# Patient Record
Sex: Male | Born: 1985 | Race: White | Hispanic: No | Marital: Married | Smoking: Never smoker
Health system: Southern US, Community
[De-identification: ages and names within clinical notes are randomized; demographics above are authoritative.]

## PROBLEM LIST (undated history)

## (undated) DIAGNOSIS — J45909 Unspecified asthma, uncomplicated: Secondary | ICD-10-CM

## (undated) DIAGNOSIS — G4730 Sleep apnea, unspecified: Secondary | ICD-10-CM

## (undated) DIAGNOSIS — F419 Anxiety disorder, unspecified: Secondary | ICD-10-CM

## (undated) DIAGNOSIS — J069 Acute upper respiratory infection, unspecified: Secondary | ICD-10-CM

## (undated) HISTORY — PX: NO PAST SURGERIES: SHX2092

## (undated) HISTORY — DX: Unspecified asthma, uncomplicated: J45.909

## (undated) HISTORY — DX: Acute upper respiratory infection, unspecified: J06.9

## (undated) HISTORY — PX: SINOSCOPY: SHX187

---

## 2010-09-29 ENCOUNTER — Other Ambulatory Visit: Payer: Self-pay | Admitting: Endocrinology

## 2010-09-29 DIAGNOSIS — E291 Testicular hypofunction: Secondary | ICD-10-CM

## 2010-10-04 ENCOUNTER — Ambulatory Visit
Admission: RE | Admit: 2010-10-04 | Discharge: 2010-10-04 | Disposition: A | Discharge status: Home / Self Care | Payer: BC Managed Care – PPO | Source: Ambulatory Visit | Attending: Endocrinology | Admitting: Endocrinology

## 2010-10-04 DIAGNOSIS — E291 Testicular hypofunction: Secondary | ICD-10-CM

## 2010-10-04 MED ORDER — GADOBENATE DIMEGLUMINE 529 MG/ML IV SOLN
10.0000 mL | Freq: Once | INTRAVENOUS | Status: AC | PRN
  Administered 2010-10-04: 10 mL via INTRAVENOUS

## 2014-03-03 ENCOUNTER — Ambulatory Visit: Payer: Self-pay

## 2014-03-03 ENCOUNTER — Other Ambulatory Visit: Payer: Self-pay | Admitting: Occupational Medicine

## 2014-03-03 DIAGNOSIS — R52 Pain, unspecified: Secondary | ICD-10-CM

## 2015-03-08 ENCOUNTER — Other Ambulatory Visit (HOSPITAL_COMMUNITY): Payer: Self-pay | Admitting: Family Medicine

## 2015-03-08 DIAGNOSIS — R221 Localized swelling, mass and lump, neck: Principal | ICD-10-CM

## 2015-03-08 DIAGNOSIS — R220 Localized swelling, mass and lump, head: Secondary | ICD-10-CM

## 2015-03-10 ENCOUNTER — Ambulatory Visit (HOSPITAL_COMMUNITY)
Admission: RE | Admit: 2015-03-10 | Discharge: 2015-03-10 | Disposition: A | Discharge status: Home / Self Care | Payer: PRIVATE HEALTH INSURANCE | Source: Ambulatory Visit | Attending: Family Medicine | Admitting: Family Medicine

## 2015-03-10 DIAGNOSIS — R220 Localized swelling, mass and lump, head: Secondary | ICD-10-CM

## 2015-03-10 DIAGNOSIS — R221 Localized swelling, mass and lump, neck: Secondary | ICD-10-CM | POA: Insufficient documentation

## 2018-12-26 ENCOUNTER — Ambulatory Visit (INDEPENDENT_AMBULATORY_CARE_PROVIDER_SITE_OTHER): Payer: Self-pay | Admitting: Internal Medicine

## 2019-05-17 ENCOUNTER — Other Ambulatory Visit (INDEPENDENT_AMBULATORY_CARE_PROVIDER_SITE_OTHER): Payer: Self-pay | Admitting: Internal Medicine

## 2019-06-26 ENCOUNTER — Encounter (INDEPENDENT_AMBULATORY_CARE_PROVIDER_SITE_OTHER): Payer: Self-pay | Admitting: Internal Medicine

## 2019-06-26 ENCOUNTER — Ambulatory Visit (INDEPENDENT_AMBULATORY_CARE_PROVIDER_SITE_OTHER): Payer: PRIVATE HEALTH INSURANCE | Admitting: Internal Medicine

## 2019-06-26 ENCOUNTER — Other Ambulatory Visit: Payer: Self-pay

## 2019-06-26 VITALS — BP 130/70 | HR 64 | Ht 67.0 in | Wt 205.0 lb

## 2019-06-26 DIAGNOSIS — Z125 Encounter for screening for malignant neoplasm of prostate: Secondary | ICD-10-CM

## 2019-06-26 DIAGNOSIS — Z23 Encounter for immunization: Secondary | ICD-10-CM

## 2019-06-26 DIAGNOSIS — Z0001 Encounter for general adult medical examination with abnormal findings: Secondary | ICD-10-CM | POA: Diagnosis not present

## 2019-06-26 DIAGNOSIS — R7989 Other specified abnormal findings of blood chemistry: Secondary | ICD-10-CM

## 2019-06-26 DIAGNOSIS — F988 Other specified behavioral and emotional disorders with onset usually occurring in childhood and adolescence: Secondary | ICD-10-CM | POA: Insufficient documentation

## 2019-06-26 HISTORY — DX: Other specified abnormal findings of blood chemistry: R79.89

## 2019-06-26 HISTORY — DX: Other specified behavioral and emotional disorders with onset usually occurring in childhood and adolescence: F98.8

## 2019-06-26 NOTE — Progress Notes (Signed)
Chief Complaint: This very pleasant 33 year old man comes in for his annual physical exam. HPI: He is doing reasonably well but he continues to describe feeling exhausted for the next 24 hours after sexual intercourse.  He has had this issue for a long time and there does not seem to be any clear etiology or remedy. He has a history of low testosterone and takes testosterone therapy which he tolerates very well.  His last levels were in a good range. He continues to exercise on a regular basis now. He does have a history of vitamin D deficiency and his last levels were in a good range when he was taking appropriate doses.  Past Medical History:  Diagnosis Date  . ADD (attention deficit disorder) 06/26/2019  . 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 No pertinent surgical history.   Social History   Social History Narrative   Married for 10 years.Lives with wife and daughter.Engineer, structural in [place].    Social History   Tobacco Use  . Smoking status: Never Smoker  . Smokeless tobacco: Never Used  Substance Use Topics  . Alcohol use: Not on file    Comment: occ      Allergies: Not on File   Current Meds  Medication Sig  . Cholecalciferol (VITAMIN D3) 125 MCG (5000 UT) TABS Take 2 tablets by mouth daily.  Marland Kitchen testosterone cypionate (DEPOTESTOSTERONE CYPIONATE) 200 MG/ML injection INJECT ONE ML INTRAMUSCULARLY TWICE A WEEK.  Marland Kitchen thyroid (NP THYROID) 90 MG tablet Take 180 mg by mouth daily.      Bio-identical hormones Testosterone therapy is being used off label for symptoms of testosterone deficiency and benefits that it produces based on several studies.  These benefits include decreasing body fat, increasing in lean muscle mass and increasing in bone density.  There is improvement of memory, cognition.  There is improvement in exercise tolerance and endurance.  Testosterone therapy has also been shown to be protective against coronary artery disease,  cerebrovascular disease, diabetes, hypertension and degenerative joint disease. I have discussed with the patient the FDA warnings regarding testosterone therapy, benefits and side effects and modes of administration as well as monitoring blood levels and side effects  on a regular basis The patient is agreeable that testosterone therapy should be an integral part of his/her wellness,quality of life and prevention of chronic disease.  [id number] from the symptoms mentioned above,there are no other symptoms referable to all systems reviewed.  Physical Exam: Blood pressure 130/70, pulse 64, height '5\' 7"'  (1.702 m), weight 205 lb (93 kg). Vitals with BMI 06/26/2019  Height '5\' 7"'   Weight 205 lbs  BMI 53.7  Systolic 482  Diastolic 70  Pulse 64      He looks systemically well. General: Alert, cooperative, and appears to be the stated age.No pallor.  No jaundice.  No clubbing. Head: Normocephalic Eyes: Sclera white, pupils equal and reactive to light, red reflex x 2,  Ears: Normal bilaterally Oral cavity: Lips, mucosa, and tongue normal: Teeth and gums normal Neck: No adenopathy, supple, symmetrical, trachea midline, and thyroid does not appear enlarged Respiratory: Clear to auscultation bilaterally.No wheezing, crackles or bronchial breathing. Cardiovascular: Heart sounds are present and appear to be normal without murmurs or added sounds.  No carotid bruits.  Peripheral pulses are present and equal bilaterally.: Gastrointestinal:positive bowel sounds, no hepatosplenomegaly.  No masses felt.No tenderness. Skin: Clear, No rashes noted.No worrisome skin lesions seen. Neurological: Grossly intact without focal findings, cranial nerves II through XII  intact, muscle strength equal bilaterally Musculoskeletal: No acute joint abnormalities noted.Full range of movement noted with joints. Psychiatric: Affect appropriate, non-anxious.    Assessment  1. Encounter for general adult medical  examination with abnormal findings   2. Attention deficit disorder, unspecified hyperactivity presence   3. Low testosterone in male   4. Special screening for malignant neoplasm of prostate     Tests Ordered:   Orders Placed This Encounter  Procedures  . CMP with eGFR([facility])  . T3, Free  . PSA     Plan  1. This is a healthy 33 year old man. 2. Blood work is ordered as above. 3. He was given influenza vaccination today. 4. Further recommendations will depend on blood results and I will see him in 6 months time for follow-up     No orders of the defined types were placed in this encounter.     C    06/26/2019, 10:53 AM

## 2019-06-27 LAB — COMPLETE METABOLIC PANEL WITH GFR
AG Ratio: 1.8 (calc) (ref 1.0–2.5)
ALT: 40 U/L (ref 9–46)
AST: 27 U/L (ref 10–40)
Albumin: 4.4 g/dL (ref 3.6–5.1)
Alkaline phosphatase (APISO): 48 U/L (ref 36–130)
BUN/Creatinine Ratio: 14 (calc) (ref 6–22)
BUN: 19 mg/dL (ref 7–25)
CO2: 27 mmol/L (ref 20–32)
Calcium: 9.6 mg/dL (ref 8.6–10.3)
Chloride: 102 mmol/L (ref 98–110)
Creat: 1.38 mg/dL — ABNORMAL HIGH (ref 0.60–1.35)
GFR, Est African American: 77 mL/min/{1.73_m2} (ref >=60)
GFR, Est Non African American: 67 mL/min/{1.73_m2} (ref >=60)
Globulin: 2.5 g/dL (calc) (ref 1.9–3.7)
Glucose, Bld: 82 mg/dL (ref 65–99)
Potassium: 4 mmol/L (ref 3.5–5.3)
Sodium: 137 mmol/L (ref 135–146)
Total Bilirubin: 0.8 mg/dL (ref 0.2–1.2)
Total Protein: 6.9 g/dL (ref 6.1–8.1)

## 2019-06-27 LAB — T3, FREE: T3, Free: 3.2 pg/mL (ref 2.3–4.2)

## 2019-06-27 LAB — PSA: PSA: 0.6 ng/mL (ref <=4.0)

## 2019-07-10 ENCOUNTER — Other Ambulatory Visit: Payer: Self-pay

## 2019-07-10 DIAGNOSIS — Z20822 Contact with and (suspected) exposure to covid-19: Secondary | ICD-10-CM

## 2019-07-13 LAB — NOVEL CORONAVIRUS, NAA: SARS-CoV-2, NAA: DETECTED — AB

## 2019-08-11 ENCOUNTER — Other Ambulatory Visit (INDEPENDENT_AMBULATORY_CARE_PROVIDER_SITE_OTHER): Payer: Self-pay | Admitting: Internal Medicine

## 2019-08-11 ENCOUNTER — Telehealth (INDEPENDENT_AMBULATORY_CARE_PROVIDER_SITE_OTHER): Payer: Self-pay

## 2019-08-12 NOTE — Telephone Encounter (Signed)
Faxed to office wife at her work.

## 2019-08-27 ENCOUNTER — Other Ambulatory Visit (INDEPENDENT_AMBULATORY_CARE_PROVIDER_SITE_OTHER): Payer: Self-pay | Admitting: Internal Medicine

## 2019-11-03 ENCOUNTER — Other Ambulatory Visit (INDEPENDENT_AMBULATORY_CARE_PROVIDER_SITE_OTHER): Payer: Self-pay | Admitting: Internal Medicine

## 2019-12-24 ENCOUNTER — Other Ambulatory Visit: Payer: Self-pay

## 2019-12-24 ENCOUNTER — Ambulatory Visit (INDEPENDENT_AMBULATORY_CARE_PROVIDER_SITE_OTHER): Payer: PRIVATE HEALTH INSURANCE | Admitting: Internal Medicine

## 2019-12-24 ENCOUNTER — Encounter (INDEPENDENT_AMBULATORY_CARE_PROVIDER_SITE_OTHER): Payer: Self-pay | Admitting: Internal Medicine

## 2019-12-24 VITALS — BP 132/80 | HR 68 | Temp 97.2°F | Resp 18 | Ht 67.0 in | Wt 204.0 lb

## 2019-12-24 DIAGNOSIS — E559 Vitamin D deficiency, unspecified: Secondary | ICD-10-CM

## 2019-12-24 DIAGNOSIS — R7989 Other specified abnormal findings of blood chemistry: Secondary | ICD-10-CM

## 2019-12-24 NOTE — Progress Notes (Signed)
Metrics: Intervention Frequency ACO  Documented Smoking Status Yearly  Screened one or more times in 24 months  Cessation Counseling or  Active cessation medication Past 24 months  Past 24 months   Guideline developer: UpToDate (See UpToDate for funding source) Date Released: 2014       Wellness Office Visit  Subjective:  Patient ID: Brady Conley, male    DOB: 02/24/1986  Age: 34 y.o. MRN: [id number]  CC: This man comes in for follow-up of his hypogonadism, vitamin D deficiency. HPI  He is doing very well.  He continues on testosterone therapy as before and he feels well on this particular dose. He also continues on NP thyroid, off label, for symptoms of thyroid deficiency. He also continues on vitamin D3 supplementation for vitamin D deficiency. He continues to workout with strength training at least twice a week and then cardio with bicycling once a week. He has had recently a lot of stress as they have just finished  building a new house. Past Medical History:  Diagnosis Date  . ADD (attention deficit disorder) 06/26/2019  . Low testosterone in male 06/26/2019      History reviewed. No pertinent family history.  Social History   Social History Narrative   Married for 10 years.Lives with wife and daughter.Emergency planning/management officer in [place].   Social History   Tobacco Use  . Smoking status: Never Smoker  . Smokeless tobacco: Never Used  Substance Use Topics  . Alcohol use: Not on file    Comment: occ    Current Meds  Medication Sig  . Cholecalciferol (VITAMIN D3) 125 MCG (5000 UT) TABS Take 2 tablets by mouth daily.  . meloxicam (MOBIC) 15 MG tablet Take 15 mg by mouth daily.  . NP THYROID 90 MG tablet TAKE ONE TABLET BY MOUTH TWICE DAILY.  Marland Kitchen testosterone cypionate (DEPOTESTOSTERONE CYPIONATE) 200 MG/ML injection INJECT ONE ML INTRAMUSCULARLY TWICE A WEEK.       Objective:   Today's Vitals: BP 132/80 (BP Location: Left Arm, Patient Position: Sitting, Cuff Size:  Normal)   Pulse 68   Temp (!) 97.2 F (36.2 C) (Temporal)   Resp 18   Ht 5\' 7"  (1.702 m)   Wt 204 lb (92.5 kg)   SpO2 98% Comment: wearing mask.  BMI 31.95 kg/m  Vitals with BMI 12/24/2019 06/26/2019  Height 5\' 7"  5\' 7"   Weight 204 lbs 205 lbs  BMI 31.94 32.1  Systolic 132 130  Diastolic 80 70  Pulse 68 64     Physical Exam   He looks systemically well.  Weight is stable.  Blood pressure is reasonable.  He is alert and oriented without any focal neurological signs.    Assessment   1. Low testosterone in male   2. Vitamin D deficiency disease       Tests ordered No orders of the defined types were placed in this encounter.    Plan: 1. He will continue with all medications above. 2. He seems to be very stable at this point and I will see him in about 6 months time for an annual physical exam.   No orders of the defined types were placed in this encounter.   13/12/2018, MD

## 2019-12-26 ENCOUNTER — Other Ambulatory Visit (INDEPENDENT_AMBULATORY_CARE_PROVIDER_SITE_OTHER): Payer: Self-pay | Admitting: Internal Medicine

## 2020-03-01 ENCOUNTER — Other Ambulatory Visit (INDEPENDENT_AMBULATORY_CARE_PROVIDER_SITE_OTHER): Payer: Self-pay | Admitting: Internal Medicine

## 2020-03-23 ENCOUNTER — Other Ambulatory Visit: Payer: Self-pay

## 2020-03-23 ENCOUNTER — Ambulatory Visit
Admission: EM | Admit: 2020-03-23 | Discharge: 2020-03-23 | Disposition: A | Discharge status: Home / Self Care | Payer: No Typology Code available for payment source | Attending: Emergency Medicine | Admitting: Emergency Medicine

## 2020-03-23 ENCOUNTER — Encounter: Payer: Self-pay | Admitting: Emergency Medicine

## 2020-03-23 DIAGNOSIS — Z1152 Encounter for screening for COVID-19: Secondary | ICD-10-CM

## 2020-03-23 DIAGNOSIS — J069 Acute upper respiratory infection, unspecified: Secondary | ICD-10-CM

## 2020-03-23 DIAGNOSIS — R0981 Nasal congestion: Secondary | ICD-10-CM

## 2020-03-23 MED ORDER — PREDNISONE 20 MG PO TABS: 20.0000 mg | ORAL_TABLET | Freq: Two times a day (BID) | ORAL | 0 refills | Status: AC

## 2020-03-23 MED ORDER — BENZONATATE 100 MG PO CAPS
100.0000 mg | ORAL_CAPSULE | Freq: Three times a day (TID) | ORAL | 0 refills | Status: DC
Start: 2020-03-23 — End: 2021-10-06

## 2020-03-23 NOTE — ED Triage Notes (Signed)
Provider triage  

## 2020-03-23 NOTE — ED Provider Notes (Signed)
[facility]   [id number] 03/23/20 Arrival Time: 3474   CC: Nasal congestion  SUBJECTIVE: History from: patient.  Brady Conley is a 34 y.o. male who presents with nasal congestion and mild dry/ productive cough x 3 days.  Denies sick exposure to COVID, flu or strep.  Has tried OTC flonase without relief.  Symptoms are made worse at night.  Reports previous COVID infection in the past.   Denies fever, chills, fatigue, rhinorrhea, sore throat, SOB, wheezing, chest pain, nausea, changes in bowel or bladder habits.    ROS: As per HPI.  All other pertinent ROS negative.     Past Medical History:  Diagnosis Date  . ADD (attention deficit disorder) 06/26/2019  . 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 No pertinent surgical history. No Known Allergies No current facility-administered medications on file prior to encounter.   Current Outpatient Medications on File Prior to Encounter  Medication Sig Dispense Refill  . Cholecalciferol (VITAMIN D3) 125 MCG (5000 UT) TABS Take 2 tablets by mouth daily.    . meloxicam (MOBIC) 15 MG tablet Take 15 mg by mouth daily.    . NP THYROID 90 MG tablet TAKE ONE TABLET BY MOUTH TWICE DAILY. 180 tablet 0  . testosterone cypionate (DEPOTESTOSTERONE CYPIONATE) 200 MG/ML injection INJECT ONE ML INTRAMUSCULARLY TWICE A WEEK. 10 mL 2  . [DISCONTINUED] thyroid (NP THYROID) 90 MG tablet Take 180 mg by mouth daily.     Social History   Socioeconomic History  . Marital status: Married    Spouse name: Not on file  . Number of children: Not on file  . Years of education: Not on file  . Highest education level: Not on file  Occupational History  . Not on file  Tobacco Use  . Smoking status: Never Smoker  . Smokeless tobacco: Never Used  Substance and Sexual Activity  . Alcohol use: Not on file    Comment: occ  . Drug use: Not Currently  . Sexual activity: Not on file  Other Topics Concern  . Not on file  Social History  Narrative   Married for 10 years.Lives with wife and daughter.Emergency planning/management officer in [place].   Social Determinants of Health   Financial Resource Strain:   . Difficulty of Paying Living Expenses:   Food Insecurity:   . Worried About Programme researcher, broadcasting/film/video in the Last Year:   . Barista in the Last Year:   Transportation Needs:   . Freight forwarder (Medical):   Marland Kitchen Lack of Transportation (Non-Medical):   Physical Activity:   . Days of Exercise per Week:   . Minutes of Exercise per Session:   Stress:   . Feeling of Stress :   Social Connections:   . Frequency of Communication with Friends and Family:   . Frequency of Social Gatherings with Friends and Family:   . Attends Religious Services:   . Active Member of Clubs or Organizations:   . Attends Banker Meetings:   Marland Kitchen Marital Status:   Intimate Partner Violence:   . Fear of Current or Ex-Partner:   . Emotionally Abused:   Marland Kitchen Physically Abused:   . Sexually Abused:    History reviewed. No pertinent family history.  OBJECTIVE:  Vitals:   03/23/20 0840  BP: (!) 147/86  Pulse: 71  Resp: 17  Temp: 98.2 F (36.8 C)  TempSrc: Oral  SpO2: 98%    General appearance: alert; well-appearing, nontoxic; speaking in  full sentences and tolerating own secretions HEENT: NCAT; Ears: EACs clear, TMs pearly gray; Eyes: PERRL.  EOM grossly intact.Nose: nares patent without rhinorrhea, Throat: oropharynx clear, tonsils non erythematous or enlarged, uvula midline  Neck: supple without LAD Lungs: unlabored respirations, symmetrical air entry; cough: absent; no respiratory distress; CTAB Heart: regular rate and rhythm.  Skin: warm and dry Psychological: alert and cooperative; normal mood and affect   ASSESSMENT & PLAN:  1. Encounter for screening for COVID-19   2. Viral URI with cough   3. Nasal congestion     Meds ordered this encounter  Medications  . benzonatate (TESSALON) 100 MG capsule    Sig: Take 1 capsule (100  mg total) by mouth every 8 (eight) hours.    Dispense:  21 capsule    Refill:  0    Order Specific Question:   Supervising Provider    Answer:   Eustace Moore [[id number]]  . predniSONE (DELTASONE) 20 MG tablet    Sig: Take 1 tablet (20 mg total) by mouth 2 (two) times daily with a meal for 5 days.    Dispense:  10 tablet    Refill:  0    Order Specific Question:   Supervising Provider    Answer:   Eustace Moore [[id number]]    COVID testing ordered.  It will take between 2-5 days for test results.  Someone will contact you regarding abnormal results.    In the meantime: You should remain isolated in your home for 10 days from symptom onset AND greater than 72 hours after symptoms resolution (absence of fever without the use of fever-reducing medication and improvement in respiratory symptoms), whichever is longer Get plenty of rest and push fluids Tessalon Perles prescribed for cough Use OTC zyrtec for nasal congestion, runny nose, and/or sore throat Use OTC flonase for nasal congestion and runny nose Use medications daily for symptom relief Use OTC medications like ibuprofen or tylenol as needed fever or pain Call or go to the ED if you have any new or worsening symptoms such as fever, worsening cough, shortness of breath, chest tightness, chest pain, turning blue, changes in mental status, etc...   Prednisone prescribed for nasal congestion  Reviewed expectations re: course of current medical issues. Questions answered. Outlined signs and symptoms indicating need for more acute intervention. Patient verbalized understanding. After Visit Summary given.         Alvino Chapel [place], PA-C 03/23/20 [phone]

## 2020-03-23 NOTE — Discharge Instructions (Signed)

## 2020-03-24 LAB — NOVEL CORONAVIRUS, NAA: SARS-CoV-2, NAA: NOT DETECTED

## 2020-03-24 LAB — SARS-COV-2, NAA 2 DAY TAT

## 2020-03-29 ENCOUNTER — Telehealth (INDEPENDENT_AMBULATORY_CARE_PROVIDER_SITE_OTHER): Payer: Self-pay

## 2020-03-29 NOTE — Telephone Encounter (Signed)
COVER MY MEDS: Request Reference Number: [id number]. TESTOST CYP INJ 200MG /ML is approved through 03/29/2021. Your patient may now fill this prescription and it will be covered.

## 2020-04-16 ENCOUNTER — Other Ambulatory Visit: Payer: Self-pay

## 2020-04-16 ENCOUNTER — Other Ambulatory Visit (HOSPITAL_COMMUNITY): Payer: Self-pay | Admitting: Family Medicine

## 2020-04-16 ENCOUNTER — Ambulatory Visit (HOSPITAL_COMMUNITY)
Admission: RE | Admit: 2020-04-16 | Discharge: 2020-04-16 | Disposition: A | Discharge status: Home / Self Care | Payer: No Typology Code available for payment source | Source: Ambulatory Visit | Attending: Family Medicine | Admitting: Family Medicine

## 2020-04-16 DIAGNOSIS — R059 Cough, unspecified: Secondary | ICD-10-CM

## 2020-04-16 DIAGNOSIS — R05 Cough: Secondary | ICD-10-CM | POA: Diagnosis not present

## 2020-05-17 LAB — LAB REPORT - SCANNED
A1c: 5.1
EGFR (Non-African Amer.): 67
Free T4: 1.33 ng/dL
TSH: 4.34

## 2020-05-20 ENCOUNTER — Other Ambulatory Visit (INDEPENDENT_AMBULATORY_CARE_PROVIDER_SITE_OTHER): Payer: Self-pay | Admitting: Internal Medicine

## 2020-05-20 NOTE — Telephone Encounter (Signed)
Request for NP thyroid refill.  

## 2020-05-21 ENCOUNTER — Other Ambulatory Visit (INDEPENDENT_AMBULATORY_CARE_PROVIDER_SITE_OTHER): Payer: Self-pay | Admitting: Internal Medicine

## 2020-05-21 MED ORDER — THYROID 90 MG PO TABS
90.0000 mg | ORAL_TABLET | Freq: Two times a day (BID) | ORAL | 0 refills | Status: DC
Start: 2020-05-21 — End: 2021-10-06

## 2020-06-28 ENCOUNTER — Encounter (INDEPENDENT_AMBULATORY_CARE_PROVIDER_SITE_OTHER): Payer: No Typology Code available for payment source | Admitting: Internal Medicine

## 2021-08-13 ENCOUNTER — Ambulatory Visit (INDEPENDENT_AMBULATORY_CARE_PROVIDER_SITE_OTHER): Payer: No Typology Code available for payment source

## 2021-08-13 ENCOUNTER — Ambulatory Visit
Admission: EM | Admit: 2021-08-13 | Discharge: 2021-08-13 | Disposition: A | Discharge status: Home / Self Care | Payer: No Typology Code available for payment source | Attending: Physician Assistant | Admitting: Physician Assistant

## 2021-08-13 ENCOUNTER — Encounter: Payer: Self-pay | Admitting: Emergency Medicine

## 2021-08-13 ENCOUNTER — Other Ambulatory Visit: Payer: Self-pay

## 2021-08-13 DIAGNOSIS — R051 Acute cough: Secondary | ICD-10-CM | POA: Diagnosis not present

## 2021-08-13 DIAGNOSIS — R059 Cough, unspecified: Secondary | ICD-10-CM

## 2021-08-13 DIAGNOSIS — J0191 Acute recurrent sinusitis, unspecified: Secondary | ICD-10-CM

## 2021-08-13 DIAGNOSIS — R0602 Shortness of breath: Secondary | ICD-10-CM

## 2021-08-13 DIAGNOSIS — R509 Fever, unspecified: Secondary | ICD-10-CM

## 2021-08-13 MED ORDER — ACETAMINOPHEN 325 MG PO TABS
975.0000 mg | ORAL_TABLET | Freq: Once | ORAL | Status: AC
  Administered 2021-08-13: 12:00:00 975 mg via ORAL

## 2021-08-13 MED ORDER — PREDNISONE 10 MG (21) PO TBPK: ORAL_TABLET | Freq: Every day | ORAL | 0 refills | Status: DC

## 2021-08-13 MED ORDER — AMOXICILLIN-POT CLAVULANATE 875-125 MG PO TABS: 1.0000 | ORAL_TABLET | Freq: Two times a day (BID) | ORAL | 0 refills | Status: DC

## 2021-08-13 NOTE — ED Triage Notes (Signed)
Pt is present today with fever and cough. Pt states that sx started Tuesday

## 2021-08-13 NOTE — ED Provider Notes (Signed)
[facility]    CSN: [id number] Arrival date & time: 08/13/21  1014      History   Chief Complaint Chief Complaint  Patient presents with   Fever   Cough    HPI Brady Conley is a 35 y.o. male.   Pt complains of congestion, fever, and cough.  He has been dealing with symptoms over the last week.  Completed Augmentin 08/07/2021.  Had left over prednisone and took that as well.  He was feeling much better until Tuesday when he started coughing again.  Seen by PCP on Thursday.  He was prescribed inhaler and tessalon pearls. Yesterday he began experiencing increased sinus pressure.  Negative home COVID test this morning. He denies night sweats,   He is currently taking cherry tussin with some relief.    Past Medical History:  Diagnosis Date   ADD (attention deficit disorder) 06/26/2019   Low testosterone in male 06/26/2019    Patient Active Problem List   Diagnosis Date Noted   ADD (attention deficit disorder) 06/26/2019   Low testosterone in male 06/26/2019    History reviewed. No pertinent surgical history.     Home Medications    Prior to Admission medications   Medication Sig Start Date End Date Taking? Authorizing Provider  amoxicillin-clavulanate (AUGMENTIN) 875-125 MG tablet Take 1 tablet by mouth every 12 (twelve) hours. 08/13/21  Yes Ward, Lenise Arena, PA-C  predniSONE (STERAPRED UNI-PAK 21 TAB) 10 MG (21) TBPK tablet Take by mouth daily. Take 6 tabs by mouth daily  for 2 days, then 5 tabs for 2 days, then 4 tabs for 2 days, then 3 tabs for 2 days, 2 tabs for 2 days, then 1 tab by mouth daily for 2 days 08/13/21  Yes Ward, Lenise Arena, PA-C  albuterol (VENTOLIN HFA) 108 (90 Base) MCG/ACT inhaler SMARTSIG:1-2 Puff(s) Via Inhaler Every 4 Hours PRN 08/11/21   [provider]  ALPRAZolam Duanne Moron) 0.5 MG tablet Take 0.5 mg by mouth daily. 06/01/21   [provider]  anastrozole (ARIMIDEX) 1 MG tablet SMARTSIG:0.5 Tablet(s) By Mouth Once a  Week 06/14/21   [provider]  benzonatate (TESSALON) 100 MG capsule Take 1 capsule (100 mg total) by mouth every 8 (eight) hours. 03/23/20   Wurst, [place], PA-C  Cholecalciferol (VITAMIN D3) 125 MCG (5000 UT) TABS Take 2 tablets by mouth daily.    [provider]  meloxicam (MOBIC) 15 MG tablet Take 15 mg by mouth daily.    [provider]  tadalafil (CIALIS) 5 MG tablet Take 5 mg by mouth daily. 07/13/21   [provider]  testosterone cypionate (DEPOTESTOSTERONE CYPIONATE) 200 MG/ML injection INJECT ONE ML INTRAMUSCULARLY TWICE A WEEK. 03/01/20   Hurshel Party C, MD  thyroid (NP THYROID) 90 MG tablet Take 1 tablet (90 mg total) by mouth 2 (two) times daily. 05/21/20   Doree Albee, MD  valACYclovir (VALTREX) 1000 MG tablet SMARTSIG:1 Tablet(s) By Mouth Every 12 Hours 05/20/21   [provider]    Family History History reviewed. No pertinent family history.  Social History Social History   Tobacco Use   Smoking status: Never   Smokeless tobacco: Never  Substance Use Topics   Drug use: Not Currently     Allergies   Patient has no known allergies.   Review of Systems Review of Systems  Constitutional:  Positive for fever. Negative for chills.  HENT:  Positive for congestion. Negative for ear pain and sore throat.   Eyes:  Negative for pain and visual disturbance.  Respiratory:  Positive for cough. Negative for shortness of breath.   Cardiovascular:  Negative for chest pain and palpitations.  Gastrointestinal:  Negative for abdominal pain and vomiting.  Genitourinary:  Negative for dysuria and hematuria.  Musculoskeletal:  Negative for arthralgias and back pain.  Skin:  Negative for color change and rash.  Neurological:  Negative for seizures and syncope.  All other systems reviewed and are negative.   Physical Exam Triage Vital Signs ED Triage Vitals  Enc Vitals Group     BP 08/13/21 1145 (!) 150/61     Pulse Rate  08/13/21 1145 (!) 126     Resp 08/13/21 1145 19     Temp 08/13/21 1145 (!) 101 F (38.3 C)     Temp Source 08/13/21 1145 Oral     SpO2 08/13/21 1145 99 %     Weight --      Height --      Head Circumference --      Peak Flow --      Pain Score 08/13/21 1146 0     Pain Loc --      Pain Edu? --      Excl. in GC? --    No data found.  Updated Vital Signs BP (!) 150/61    Pulse (!) 126    Temp 99.7 F (37.6 C) (Oral)    Resp 19    SpO2 99%   Visual Acuity Right Eye Distance:   Left Eye Distance:   Bilateral Distance:    Right Eye Near:   Left Eye Near:    Bilateral Near:     Physical Exam Vitals and nursing note reviewed.  Constitutional:      General: He is not in acute distress.    Appearance: He is well-developed.  HENT:     Head: Normocephalic and atraumatic.  Eyes:     Conjunctiva/sclera: Conjunctivae normal.  Cardiovascular:     Rate and Rhythm: Normal rate and regular rhythm.     Heart sounds: No murmur heard. Pulmonary:     Effort: Pulmonary effort is normal. No respiratory distress.     Breath sounds: Normal breath sounds.  Abdominal:     Palpations: Abdomen is soft.     Tenderness: There is no abdominal tenderness.  Musculoskeletal:        General: No swelling.     Cervical back: Neck supple.  Skin:    General: Skin is warm and dry.     Capillary Refill: Capillary refill takes less than 2 seconds.  Neurological:     Mental Status: He is alert.  Psychiatric:        Mood and Affect: Mood normal.     UC Treatments / Results  Labs (all labs ordered are listed, but only abnormal results are displayed) Labs Reviewed  COVID-19, FLU A+B NAA    EKG   Radiology DG Chest 2 View  Result Date: 08/13/2021 CLINICAL DATA:  Dry cough for 1 month, fever, shortness of breath the coughing, history hypertension, asthma, former smoker EXAM: CHEST - 2 VIEW COMPARISON:  04/16/2020 FINDINGS: Normal heart size, mediastinal contours, and pulmonary vascularity.  Lungs clear. No pleural effusion or pneumothorax. Bones unremarkable. IMPRESSION: No acute abnormalities. Electronically Signed   By: Ulyses Southward M.D.   On: 08/13/2021 12:54    Procedures Procedures (including critical care time)  Medications Ordered in UC Medications  acetaminophen (TYLENOL) tablet 975 mg (975 mg Oral Given  08/13/21 1148)    Initial Impression / Assessment and Plan / UC Course  I have reviewed the triage vital signs and the nursing notes.  Pertinent labs & imaging results that were available during my care of the patient were reviewed by me and considered in my medical decision making (see chart for details).     Recurrent sinusitis.  Antibiotic prescribed.  Prednisone prescribed.  Chest xray normal.  COVID/flu test pending.  Supportive treatment discussed. Return precautions discussed.  Final Clinical Impressions(s) / UC Diagnoses   Final diagnoses:  Acute cough  Acute recurrent sinusitis, unspecified location     Discharge Instructions      Continue with cough syrup as needed Recommend flonase and mucinex Drink plenty of fluids COVID/flu test pending Return if symptoms become worse.      ED Prescriptions     Medication Sig Dispense Auth. Provider   amoxicillin-clavulanate (AUGMENTIN) 875-125 MG tablet Take 1 tablet by mouth every 12 (twelve) hours. 14 tablet Ward, Lenise Arena, PA-C   predniSONE (STERAPRED UNI-PAK 21 TAB) 10 MG (21) TBPK tablet Take by mouth daily. Take 6 tabs by mouth daily  for 2 days, then 5 tabs for 2 days, then 4 tabs for 2 days, then 3 tabs for 2 days, 2 tabs for 2 days, then 1 tab by mouth daily for 2 days 42 tablet Ward, Lenise Arena, PA-C      PDMP not reviewed this encounter.   Ward, Lenise Arena, PA-C 08/13/21 1302

## 2021-08-13 NOTE — Discharge Instructions (Signed)
Continue with cough syrup as needed Recommend flonase and mucinex Drink plenty of fluids COVID/flu test pending Return if symptoms become worse.

## 2021-08-14 LAB — COVID-19, FLU A+B NAA
Influenza A, NAA: DETECTED — AB
Influenza B, NAA: NOT DETECTED
SARS-CoV-2, NAA: NOT DETECTED

## 2021-09-08 ENCOUNTER — Ambulatory Visit: Payer: No Typology Code available for payment source | Admitting: Pulmonary Disease

## 2021-09-08 ENCOUNTER — Other Ambulatory Visit: Payer: Self-pay

## 2021-09-08 ENCOUNTER — Encounter: Payer: Self-pay | Admitting: Pulmonary Disease

## 2021-09-08 DIAGNOSIS — R053 Chronic cough: Secondary | ICD-10-CM

## 2021-09-08 MED ORDER — CHLORPHENIRAMINE MALEATE 4 MG PO TABS: 4.0000 mg | ORAL_TABLET | Freq: Every day | ORAL | 0 refills | Status: DC

## 2021-09-08 NOTE — Assessment & Plan Note (Signed)
Cause of your chronic cough could be sinus drip, GERD or cough variant asthma Clear chest x-ray is reassuring - Chlortrimeton 4 mg at bedtime x 4 weeks - Store brand phenylephrine  (walphed) 10 mg daily am x 4 weeks  - prilosec twice daily  - sample of Breo 100 once daily- rinse mouth after use   If no better , we will consider scan for sinuses and RAST panel

## 2021-09-08 NOTE — Patient Instructions (Signed)
°  Cause of your chronic cough could be sinus drip, GERD or cough variant asthma  - Chlortrimeton 4 mg at bedtime x 4 weeks - Store brand phenylephrine  (walphed) 10 mg daily am x 4 weeks  - prilosec twice daily  - sample of Breo 100 once daily- rinse mouth after use   If no better , we will consider scan for sinuses

## 2021-09-08 NOTE — Progress Notes (Signed)
Subjective:    Patient ID: Brady Conley, male    DOB: 18-May-1986, 36 y.o.   MRN: [id number]  HPI  Chief Complaint  Patient presents with   Consult    Ongoing cough after being sick.   Had asthma as a child.  Some days coughing a lot and other days not coughing at all.      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 He reports sinus issues for many years as a perennial problem for which he alternates between Zyrtec or Allegra or Claritin.  He occasionally has a lingering cough but it has never lingered so long.  He reports asthma when in elementary school but grew out of this even by middle school and was able to participate in sports and mountain bike as a young adult.  Has symptoms initially started last week of October with sinus issues that worsened into a cough.  He saw a telemetry doc at work and was given benzonatate and OTC cold medication.  He had a trip to [place] and 2 weeks later had to go to the PCP office and was given Augmentin and Cheratussin cough syrup and prednisone 40 mg for 5 days. By Thanksgiving his symptoms have not improved so he again went to the PCP office and this time was given albuterol omeprazole 20 mg twice daily, Decadron 10 mg IM started on Singulair and a Breo sample was given for 2 weeks.  None of this helped  Felt better for a few days but had an urgent care visit by 12/24, transient fever, he tested positive for flu and was given Augmentin and prednisone.  Chest x-ray which I reviewed did not show any infiltrates. Again he felt better for a couple of days and then had another urgent care visit on 1/5 where he was given Phenergan cough syrup.  He saw PCP again on 1/6 and this time was given hydrocodone cough syrup with a Z-Pak.  He reports aggravating factors including exercise, working on his bike, or hot weather. He has received some relief with Delsym, hydrocodone cough syrup and  prednisone and antibiotic. He reports occasional heartburn and denies significant wheezing except after a bout of coughing.  Albuterol actually made his coughing worse.  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  He has a dog for 12 years.  He denies any significant exposure at work  He sleeps on his left side with 1 pillow     Past Medical History:  Diagnosis Date   ADD (attention deficit disorder) 06/26/2019   Low testosterone in male 06/26/2019     History reviewed. No pertinent surgical history.  Marland Kitchenalll  Social History   Socioeconomic History   Marital status: Married    Spouse name: Not on file   Number of children: Not on file   Years of education: Not on file   Highest education level: Not on file  Occupational History   Not on file  Tobacco Use   Smoking status: Never   Smokeless tobacco: Never  Substance and Sexual Activity   Alcohol use: Not on file    Comment: occ   Drug use: Not Currently   Sexual activity: Not on file  Other Topics Concern   Not on file  Social History Narrative   Married for 10 years.Lives with wife and daughter.Engineer, structural in [place].   Social Determinants of  Health   Financial Resource Strain: Not on file  Food Insecurity: Not on file  Transportation Needs: Not on file  Physical Activity: Not on file  Stress: Not on file  Social Connections: Not on file  Intimate Partner Violence: Not on file     History reviewed. No pertinent family history.     Review of Systems Constitutional: negative for anorexia, fevers and sweats  Eyes: negative for irritation, redness and visual disturbance  Ears, nose, mouth, throat, and face: negative for earaches, epistaxis, nasal congestion and sore throat  Respiratory: negative for  dyspnea on exertion, sputum and wheezing  Cardiovascular: negative for chest pain, dyspnea, lower extremity edema, orthopnea, palpitations and syncope   Gastrointestinal: negative for abdominal pain, constipation, diarrhea, melena, nausea and vomiting  Genitourinary:negative for dysuria, frequency and hematuria  Hematologic/lymphatic: negative for bleeding, easy bruising and lymphadenopathy  Musculoskeletal:negative for arthralgias, muscle weakness and stiff joints  Neurological: negative for coordination problems, gait problems, headaches and weakness  Endocrine: negative for diabetic symptoms including polydipsia, polyuria and weight loss     Objective:   Physical Exam   Gen. Pleasant, well-nourished, in no distress, normal affect ENT - no pallor,icterus, no post nasal drip Neck: No JVD, no thyromegaly, no carotid bruits Lungs: no use of accessory muscles, no dullness to percussion, clear without rales or rhonchi  Cardiovascular: Rhythm regular, heart sounds  normal, no murmurs or gallops, no peripheral edema Abdomen: soft and non-tender, no hepatosplenomegaly, BS normal. Musculoskeletal: No deformities, no cyanosis or clubbing Neuro:  alert, non focal        Assessment & Plan:

## 2021-09-22 ENCOUNTER — Institutional Professional Consult (permissible substitution): Payer: No Typology Code available for payment source | Admitting: Pulmonary Disease

## 2021-10-06 ENCOUNTER — Other Ambulatory Visit: Payer: Self-pay

## 2021-10-06 ENCOUNTER — Encounter: Payer: Self-pay | Admitting: Pulmonary Disease

## 2021-10-06 ENCOUNTER — Ambulatory Visit (INDEPENDENT_AMBULATORY_CARE_PROVIDER_SITE_OTHER): Payer: No Typology Code available for payment source | Admitting: Pulmonary Disease

## 2021-10-06 DIAGNOSIS — R053 Chronic cough: Secondary | ICD-10-CM

## 2021-10-06 NOTE — Assessment & Plan Note (Signed)
I do feel that his main issue is postnasal drip due to perennial rhinitis, the other trigger was recurrent URIs  We will obtain RAST panel to clarify.  He takes antihistaminic all year-round  For future episodes , take chlortrimeton at night + phenylephrine (10) daytime x 2 weeks  I do not think he needs Breo anymore and he can discontinue

## 2021-10-06 NOTE — Patient Instructions (Signed)
X RAST panel  For future episodes , take chlortrimeton at night + phenylephrine (10) daytime x 2 weeks  DC Breo

## 2021-10-06 NOTE — Progress Notes (Signed)
° °  Subjective:    Patient ID: Brady Conley, male    DOB: 14-Jun-1986, 36 y.o.   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 He has received some relief with Delsym, hydrocodone cough syrup and prednisone and antibiotic.  Initial office visit -impression was recurrent URI versus chronic cough due to sinus drip or cough variant asthma We treated him with Chlor-Trimeton and phenylephrine combination and his cough gradually improved.  He got a sinus infection 1 week ago and received Z-Pak and prednisone from his PCP with improvement.  He again home tested negative for COVID x2 He feels like he is back to his baseline now He was taking Breo twice a day   Review of Systems neg for any significant sore throat, dysphagia, itching, sneezing, nasal congestion or excess/ purulent secretions, fever, chills, sweats, unintended wt loss, pleuritic or exertional cp, hempoptysis, orthopnea pnd or change in chronic leg swelling. Also denies presyncope, palpitations, heartburn, abdominal pain, nausea, vomiting, diarrhea or change in bowel or urinary habits, dysuria,hematuria, rash, arthralgias, visual complaints, headache, numbness weakness or ataxia.     Objective:   Physical Exam  Gen. Pleasant, well-nourished, in no distress ENT - no thrush, no pallor/icterus,no post nasal drip Neck: No JVD, no thyromegaly, no carotid bruits Lungs: no use of accessory muscles, no dullness to percussion, clear without rales or rhonchi  Cardiovascular: Rhythm regular, heart sounds  normal, no murmurs or gallops, no peripheral edema Musculoskeletal: No deformities, no cyanosis or clubbing        Assessment & Plan:

## 2021-10-07 LAB — RESPIRATORY ALLERGY PROFILE REGION II ~~LOC~~
Allergen, A. alternata, m6: <0.1 kU/L
Allergen, Cedar tree, t12: 2.33 kU/L — ABNORMAL HIGH
Allergen, Comm Silver Birch, t9: 2.72 kU/L — ABNORMAL HIGH
Allergen, Cottonwood, t14: 2.7 kU/L — ABNORMAL HIGH
Allergen, D pternoyssinus,d7: 0.21 kU/L — ABNORMAL HIGH
Allergen, Mouse Urine Protein, e78: <0.1 kU/L
Allergen, Mulberry, t76: 2.11 kU/L — ABNORMAL HIGH
Allergen, Oak,t7: 2.76 kU/L — ABNORMAL HIGH
Allergen, P. notatum, m1: <0.1 kU/L
Aspergillus fumigatus, m3: 0.16 kU/L — ABNORMAL HIGH
Bermuda Grass: 3.01 kU/L — ABNORMAL HIGH
Box Elder IgE: 2.84 kU/L — ABNORMAL HIGH
CLADOSPORIUM HERBARUM (M2) IGE: <0.1 kU/L
COMMON RAGWEED (SHORT) (W1) IGE: 2.86 kU/L — ABNORMAL HIGH
Cat Dander: 0.16 kU/L — ABNORMAL HIGH
Class: 0
Class: 0
Class: 0
Class: 0
Class: 1
Class: 2
Class: 2
Class: 2
Class: 2
Class: 2
Class: 2
Class: 2
Class: 2
Class: 2
Class: 2
Class: 2
Class: 2
Class: 2
Class: 2
Class: 2
Class: 2
Cockroach: 2.15 kU/L — ABNORMAL HIGH
D. farinae: 0.9 kU/L — ABNORMAL HIGH
Dog Dander: 0.46 kU/L — ABNORMAL HIGH
Elm IgE: 3.03 kU/L — ABNORMAL HIGH
IgE (Immunoglobulin E), Serum: 90 kU/L (ref <=114)
Johnson Grass: 3.05 kU/L — ABNORMAL HIGH
Pecan/Hickory Tree IgE: 2.64 kU/L — ABNORMAL HIGH
Rough Pigweed  IgE: 2.52 kU/L — ABNORMAL HIGH
Sheep Sorrel IgE: 2.89 kU/L — ABNORMAL HIGH
Timothy Grass: 2.91 kU/L — ABNORMAL HIGH

## 2021-10-07 LAB — INTERPRETATION:

## 2021-10-10 ENCOUNTER — Institutional Professional Consult (permissible substitution): Payer: No Typology Code available for payment source | Admitting: Internal Medicine

## 2022-10-02 ENCOUNTER — Encounter (HOSPITAL_BASED_OUTPATIENT_CLINIC_OR_DEPARTMENT_OTHER): Payer: Self-pay | Admitting: Otolaryngology

## 2022-10-02 ENCOUNTER — Other Ambulatory Visit: Payer: Self-pay

## 2022-10-05 ENCOUNTER — Other Ambulatory Visit: Payer: Self-pay | Admitting: Otolaryngology

## 2022-10-07 NOTE — Anesthesia Preprocedure Evaluation (Addendum)
Anesthesia Evaluation  Patient identified by MRN, date of birth, ID band Patient awake    Reviewed: Allergy & Precautions, NPO status , Patient's Chart, lab work & pertinent test results  Airway Mallampati: III  TM Distance: >3 FB Neck ROM: Full    Dental no notable dental hx. (+) Teeth Intact, Dental Advisory Given   Pulmonary sleep apnea and Continuous Positive Airway Pressure Ventilation  Positive sleep study 2 weeks ago, CPAP ordered   Pulmonary exam normal breath sounds clear to auscultation       Cardiovascular hypertension (147/72 today, per pt normally a little high but does not take meds), Normal cardiovascular exam Rhythm:Regular Rate:Normal     Neuro/Psych  PSYCHIATRIC DISORDERS Anxiety     negative neurological ROS     GI/Hepatic negative GI ROS, Neg liver ROS,,,  Endo/Other  Obesity BMI 33  Renal/GU negative Renal ROS  negative genitourinary   Musculoskeletal negative musculoskeletal ROS (+)    Abdominal  (+) + obese  Peds  Hematology negative hematology ROS (+)   Anesthesia Other Findings   Reproductive/Obstetrics negative OB ROS                              Anesthesia Physical Anesthesia Plan  ASA: 2  Anesthesia Plan: General   Post-op Pain Management: Tylenol PO (pre-op)* and Precedex   Induction: Intravenous  PONV Risk Score and Plan: 3 and Ondansetron, Dexamethasone, Midazolam and Treatment may vary due to age or medical condition  Airway Management Planned: Oral ETT  Additional Equipment: None  Intra-op Plan:   Post-operative Plan: Extubation in OR  Informed Consent: I have reviewed the patients History and Physical, chart, labs and discussed the procedure including the risks, benefits and alternatives for the proposed anesthesia with the patient or authorized representative who has indicated his/her understanding and acceptance.     Dental advisory  given  Plan Discussed with: CRNA  Anesthesia Plan Comments:         Anesthesia Quick Evaluation

## 2022-10-09 ENCOUNTER — Encounter (HOSPITAL_BASED_OUTPATIENT_CLINIC_OR_DEPARTMENT_OTHER): Payer: Self-pay | Admitting: Otolaryngology

## 2022-10-09 ENCOUNTER — Encounter (HOSPITAL_BASED_OUTPATIENT_CLINIC_OR_DEPARTMENT_OTHER)
Admission: RE | Disposition: A | Discharge status: Home / Self Care | Payer: Self-pay | Source: Home / Self Care | Attending: Otolaryngology

## 2022-10-09 ENCOUNTER — Ambulatory Visit (HOSPITAL_BASED_OUTPATIENT_CLINIC_OR_DEPARTMENT_OTHER)
Admission: RE | Admit: 2022-10-09 | Discharge: 2022-10-09 | Disposition: A | Discharge status: Home / Self Care | Payer: No Typology Code available for payment source | Attending: Otolaryngology | Admitting: Otolaryngology

## 2022-10-09 ENCOUNTER — Ambulatory Visit (HOSPITAL_BASED_OUTPATIENT_CLINIC_OR_DEPARTMENT_OTHER): Payer: No Typology Code available for payment source | Admitting: Anesthesiology

## 2022-10-09 ENCOUNTER — Other Ambulatory Visit: Payer: Self-pay

## 2022-10-09 DIAGNOSIS — J343 Hypertrophy of nasal turbinates: Secondary | ICD-10-CM

## 2022-10-09 DIAGNOSIS — J310 Chronic rhinitis: Secondary | ICD-10-CM | POA: Insufficient documentation

## 2022-10-09 DIAGNOSIS — J3489 Other specified disorders of nose and nasal sinuses: Secondary | ICD-10-CM | POA: Diagnosis not present

## 2022-10-09 DIAGNOSIS — J340 Abscess, furuncle and carbuncle of nose: Secondary | ICD-10-CM | POA: Diagnosis not present

## 2022-10-09 DIAGNOSIS — J342 Deviated nasal septum: Secondary | ICD-10-CM | POA: Diagnosis not present

## 2022-10-09 DIAGNOSIS — L980 Pyogenic granuloma: Secondary | ICD-10-CM | POA: Diagnosis not present

## 2022-10-09 HISTORY — PX: NASAL SEPTOPLASTY W/ TURBINOPLASTY: SHX2070

## 2022-10-09 HISTORY — PX: EXCISION NASAL MASS: SHX6271

## 2022-10-09 HISTORY — DX: Sleep apnea, unspecified: G47.30

## 2022-10-09 HISTORY — DX: Anxiety disorder, unspecified: F41.9

## 2022-10-09 SURGERY — SEPTOPLASTY, NOSE, WITH NASAL TURBINATE REDUCTION
Anesthesia: General | Site: Nose | Laterality: Left

## 2022-10-09 MED ORDER — ACETAMINOPHEN 500 MG PO TABS
1000.0000 mg | ORAL_TABLET | Freq: Once | ORAL | Status: AC
  Administered 2022-10-09: 1000 mg via ORAL

## 2022-10-09 MED ORDER — LIDOCAINE-EPINEPHRINE 1 %-1:100000 IJ SOLN
INTRAMUSCULAR | Status: DC | PRN
  Administered 2022-10-09: 4 mL

## 2022-10-09 MED ORDER — OXYCODONE HCL 5 MG PO TABS: 5.0000 mg | ORAL_TABLET | Freq: Once | ORAL | Status: DC | PRN

## 2022-10-09 MED ORDER — LIDOCAINE 2% (20 MG/ML) 5 ML SYRINGE
INTRAMUSCULAR | Status: AC
  Filled 2022-10-09: qty 5

## 2022-10-09 MED ORDER — ROCURONIUM BROMIDE 100 MG/10ML IV SOLN
INTRAVENOUS | Status: DC | PRN
  Administered 2022-10-09: 100 mg via INTRAVENOUS

## 2022-10-09 MED ORDER — CEFAZOLIN SODIUM-DEXTROSE 2-4 GM/100ML-% IV SOLN
INTRAVENOUS | Status: AC
  Filled 2022-10-09: qty 100

## 2022-10-09 MED ORDER — FENTANYL CITRATE (PF) 100 MCG/2ML IJ SOLN
INTRAMUSCULAR | Status: AC
  Filled 2022-10-09: qty 2

## 2022-10-09 MED ORDER — DEXAMETHASONE SODIUM PHOSPHATE 10 MG/ML IJ SOLN
INTRAMUSCULAR | Status: AC
  Filled 2022-10-09: qty 1

## 2022-10-09 MED ORDER — HYDROMORPHONE HCL 1 MG/ML IJ SOLN: 0.2500 mg | INTRAMUSCULAR | Status: DC | PRN

## 2022-10-09 MED ORDER — MUPIROCIN 2 % EX OINT
TOPICAL_OINTMENT | CUTANEOUS | Status: DC | PRN
  Administered 2022-10-09: 1 via TOPICAL

## 2022-10-09 MED ORDER — MEPERIDINE HCL 25 MG/ML IJ SOLN: 6.2500 mg | INTRAMUSCULAR | Status: DC | PRN

## 2022-10-09 MED ORDER — OXYCODONE HCL 5 MG/5ML PO SOLN: 5.0000 mg | Freq: Once | ORAL | Status: DC | PRN

## 2022-10-09 MED ORDER — AMISULPRIDE (ANTIEMETIC) 5 MG/2ML IV SOLN: 10.0000 mg | Freq: Once | INTRAVENOUS | Status: DC | PRN

## 2022-10-09 MED ORDER — SUGAMMADEX SODIUM 200 MG/2ML IV SOLN
INTRAVENOUS | Status: DC | PRN
  Administered 2022-10-09: 200 mg via INTRAVENOUS

## 2022-10-09 MED ORDER — LACTATED RINGERS IV SOLN: INTRAVENOUS | Status: DC

## 2022-10-09 MED ORDER — DEXAMETHASONE SODIUM PHOSPHATE 4 MG/ML IJ SOLN
INTRAMUSCULAR | Status: DC | PRN
  Administered 2022-10-09: 10 mg via INTRAVENOUS

## 2022-10-09 MED ORDER — OXYCODONE-ACETAMINOPHEN 5-325 MG PO TABS: 1.0000 | ORAL_TABLET | ORAL | 0 refills | Status: AC | PRN

## 2022-10-09 MED ORDER — CEFAZOLIN SODIUM-DEXTROSE 2-3 GM-%(50ML) IV SOLR
INTRAVENOUS | Status: DC | PRN
  Administered 2022-10-09: 2 g via INTRAVENOUS

## 2022-10-09 MED ORDER — LIDOCAINE HCL (CARDIAC) PF 100 MG/5ML IV SOSY
PREFILLED_SYRINGE | INTRAVENOUS | Status: DC | PRN
  Administered 2022-10-09: 100 mg via INTRAVENOUS

## 2022-10-09 MED ORDER — ACETAMINOPHEN 500 MG PO TABS
ORAL_TABLET | ORAL | Status: AC
  Filled 2022-10-09: qty 2

## 2022-10-09 MED ORDER — PROPOFOL 10 MG/ML IV BOLUS
INTRAVENOUS | Status: DC | PRN
  Administered 2022-10-09: 170 mg via INTRAVENOUS
  Administered 2022-10-09: 30 mg via INTRAVENOUS

## 2022-10-09 MED ORDER — FENTANYL CITRATE (PF) 100 MCG/2ML IJ SOLN
INTRAMUSCULAR | Status: DC | PRN
  Administered 2022-10-09 (×4): 50 ug via INTRAVENOUS

## 2022-10-09 MED ORDER — AMOXICILLIN 875 MG PO TABS: 875.0000 mg | ORAL_TABLET | Freq: Two times a day (BID) | ORAL | 0 refills | Status: AC

## 2022-10-09 MED ORDER — ONDANSETRON HCL 4 MG/2ML IJ SOLN
INTRAMUSCULAR | Status: DC | PRN
  Administered 2022-10-09: 4 mg via INTRAVENOUS

## 2022-10-09 MED ORDER — MIDAZOLAM HCL 5 MG/5ML IJ SOLN
INTRAMUSCULAR | Status: DC | PRN
  Administered 2022-10-09: 2 mg via INTRAVENOUS

## 2022-10-09 MED ORDER — ONDANSETRON HCL 4 MG/2ML IJ SOLN
INTRAMUSCULAR | Status: AC
  Filled 2022-10-09: qty 2

## 2022-10-09 MED ORDER — DEXMEDETOMIDINE HCL IN NACL 80 MCG/20ML IV SOLN
INTRAVENOUS | Status: DC | PRN
  Administered 2022-10-09: 8 ug via BUCCAL
  Administered 2022-10-09: 12 ug via BUCCAL

## 2022-10-09 MED ORDER — OXYMETAZOLINE HCL 0.05 % NA SOLN
NASAL | Status: DC | PRN
  Administered 2022-10-09: 1 via TOPICAL

## 2022-10-09 MED ORDER — ONDANSETRON HCL 4 MG/2ML IJ SOLN: 4.0000 mg | Freq: Once | INTRAMUSCULAR | Status: DC | PRN

## 2022-10-09 MED ORDER — MIDAZOLAM HCL 2 MG/2ML IJ SOLN
INTRAMUSCULAR | Status: AC
  Filled 2022-10-09: qty 2

## 2022-10-09 SURGICAL SUPPLY — 41 items
ATTRACTOMAT 16X20 MAGNETIC DRP (DRAPES) IMPLANT
BLADE SURG 15 STRL LF DISP TIS (BLADE) IMPLANT
BLADE SURG 15 STRL SS (BLADE)
CANISTER SUCT 1200ML W/VALVE (MISCELLANEOUS) ×2 IMPLANT
COAGULATOR SUCT 8FR VV (MISCELLANEOUS) ×2 IMPLANT
DEFOGGER MIRROR 1QT (MISCELLANEOUS) ×2 IMPLANT
DRSG NASOPORE 8CM (GAUZE/BANDAGES/DRESSINGS) IMPLANT
DRSG TELFA 3X8 NADH STRL (GAUZE/BANDAGES/DRESSINGS) IMPLANT
ELECT COATED BLADE 2.86 ST (ELECTRODE) IMPLANT
ELECT REM PT RETURN 9FT ADLT (ELECTROSURGICAL) ×2
ELECTRODE REM PT RTRN 9FT ADLT (ELECTROSURGICAL) ×2 IMPLANT
GLOVE BIO SURGEON STRL SZ7.5 (GLOVE) ×2 IMPLANT
GLOVE BIOGEL PI IND STRL 7.5 (GLOVE) IMPLANT
GLOVE SURG SYN 7.5  E (GLOVE) ×2
GLOVE SURG SYN 7.5 E (GLOVE) ×2 IMPLANT
GLOVE SURG SYN 7.5 PF PI (GLOVE) IMPLANT
GOWN STRL REUS W/ TWL LRG LVL3 (GOWN DISPOSABLE) ×4 IMPLANT
GOWN STRL REUS W/ TWL XL LVL3 (GOWN DISPOSABLE) IMPLANT
GOWN STRL REUS W/TWL LRG LVL3 (GOWN DISPOSABLE) ×4
GOWN STRL REUS W/TWL XL LVL3 (GOWN DISPOSABLE) ×2
NDL HYPO 25X1 1.5 SAFETY (NEEDLE) ×2 IMPLANT
NEEDLE HYPO 25X1 1.5 SAFETY (NEEDLE) ×2 IMPLANT
NS IRRIG 1000ML POUR BTL (IV SOLUTION) ×2 IMPLANT
PACK BASIN DAY SURGERY FS (CUSTOM PROCEDURE TRAY) ×2 IMPLANT
PACK ENT DAY SURGERY (CUSTOM PROCEDURE TRAY) ×2 IMPLANT
PENCIL SMOKE EVACUATOR (MISCELLANEOUS) IMPLANT
SCRUB TECHNI CARE SURGICAL (MISCELLANEOUS) IMPLANT
SLEEVE SCD COMPRESS KNEE MED (STOCKING) ×2 IMPLANT
SPIKE FLUID TRANSFER (MISCELLANEOUS) IMPLANT
SPLINT NASAL AIRWAY SILICONE (MISCELLANEOUS) ×2 IMPLANT
SPONGE GAUZE 2X2 8PLY STRL LF (GAUZE/BANDAGES/DRESSINGS) ×2 IMPLANT
SPONGE NEURO XRAY DETECT 1X3 (DISPOSABLE) ×2 IMPLANT
SUT CHROMIC 4 0 P 3 18 (SUTURE) ×2 IMPLANT
SUT PLAIN 4 0 ~~LOC~~ 1 (SUTURE) ×2 IMPLANT
SUT PROLENE 3 0 PS 2 (SUTURE) ×2 IMPLANT
SUT VIC AB 4-0 P-3 18XBRD (SUTURE) IMPLANT
SUT VIC AB 4-0 P3 18 (SUTURE)
TOWEL GREEN STERILE FF (TOWEL DISPOSABLE) ×2 IMPLANT
TUBE SALEM SUMP 12F (TUBING) IMPLANT
TUBE SALEM SUMP 16F (TUBING) ×2 IMPLANT
YANKAUER SUCT BULB TIP NO VENT (SUCTIONS) ×2 IMPLANT

## 2022-10-09 NOTE — Discharge Instructions (Addendum)
POSTOPERATIVE INSTRUCTIONS FOR PATIENTS HAVING NASAL OR SINUS OPERATIONS ACTIVITY: Restrict activity at home for the first two days, resting as much as possible. Light activity is best. You may usually return to work within a week. You should refrain from nose blowing, strenuous activity, or heavy lifting greater than 20lbs for a total of one week after your operation.  If sneezing cannot be avoided, sneeze with your mouth open. DISCOMFORT: You may experience a dull headache and pressure along with nasal congestion and discharge. These symptoms may be worse during the first week after the operation but may last as long as two to four weeks.  Please take Tylenol or the pain medication that has been prescribed for you. Do not take aspirin or aspirin containing medications since they may cause bleeding.  You may experience symptoms of post nasal drainage, nasal congestion, headaches and fatigue for two or three months after your operation.  BLEEDING: You may have some blood tinged nasal drainage for approximately two weeks after the operation.  The discharge will be worse for the first week.  Please call our office at [phone] or go to the nearest hospital emergency room if you experience any of the following: heavy, bright red blood from your nose or mouth that lasts longer than 15 minutes or coughing up or vomiting bright red blood or blood clots. GENERAL CONSIDERATIONS: A gauze dressing will be placed on your upper lip to absorb any drainage after the operation. You may need to change this several times a day.  If you do not have very much drainage, you may remove the dressing.  Remember that you may gently wipe your nose with a tissue and sniff in, but DO NOT blow your nose. Please keep all of your postoperative appointments.  Your final results after the operation will depend on proper follow-up.  The initial visit is usually 2 to 5 days after the operation.  During this visit, the remaining nasal  packing and internal septal splints will be removed.  Your nasal and sinus cavities will be cleaned.  During the second visit, your nasal and sinus cavities will be cleaned again. Have someone drive you to your first two postoperative appointments.  How you care for your nose after the operation will influence the results that you obtain.  You should follow all directions, take your medication as prescribed, and call our office [phone] with any problems or questions. You may be more comfortable sleeping with your head elevated on two pillows. Do not take any medications that we have not prescribed or recommended. WARNING SIGNS: if any of the following should occur, please call our office: Persistent fever greater than 102F. Persistent vomiting. Severe and constant pain that is not relieved by prescribed pain medication. Trauma to the nose. Rash or unusual side effects from any medicines.    Post Anesthesia Home Care Instructions  Activity: Get plenty of rest for the remainder of the day. A responsible individual must stay with you for 24 hours following the procedure.  For the next 24 hours, DO NOT: -Drive a car -Paediatric nurse -Drink alcoholic beverages -Take any medication unless instructed by your physician -Make any legal decisions or sign important papers.  Meals: Start with liquid foods such as gelatin or soup. Progress to regular foods as tolerated. Avoid greasy, spicy, heavy foods. If nausea and/or vomiting occur, drink only clear liquids until the nausea and/or vomiting subsides. Call your physician if vomiting continues.  Special Instructions/Symptoms: Your throat may feel dry  or sore from the anesthesia or the breathing tube placed in your throat during surgery. If this causes discomfort, gargle with warm salt water. The discomfort should disappear within 24 hours.      Tylenol can be taken at 1:54 pm

## 2022-10-09 NOTE — Transfer of Care (Signed)
Immediate Anesthesia Transfer of Care Note  Patient: Brady Conley  Procedure(s) Performed: NASAL SEPTOPLASTY WITH BILATERAL  TURBINATE REDUCTION (Bilateral: Nose) EXCISION NASAL MASS (Left: Nose)  Patient Location: PACU  Anesthesia Type:General  Level of Consciousness: awake, alert , and oriented  Airway & Oxygen Therapy: Patient Spontanous Breathing and Patient connected to face mask oxygen  Post-op Assessment: Report given to RN and Post -op Vital signs reviewed and stable  Post vital signs: Reviewed and stable  Last Vitals:  Vitals Value Taken Time  BP    Temp    Pulse 102 10/09/22 1100  Resp    SpO2 95 % 10/09/22 1100  Vitals shown include unvalidated device data.  Last Pain:  Vitals:   10/09/22 0752  TempSrc: Oral  PainSc: 0-No pain      Patients Stated Pain Goal: 7 ([id number])  Complications: No notable events documented.

## 2022-10-09 NOTE — Anesthesia Postprocedure Evaluation (Signed)
Anesthesia Post Note  Patient: Brady Conley  Procedure(s) Performed: NASAL SEPTOPLASTY WITH BILATERAL  TURBINATE REDUCTION (Bilateral: Nose) EXCISION NASAL MASS (Left: Nose)     Patient location during evaluation: PACU Anesthesia Type: General Level of consciousness: awake and alert, oriented and patient cooperative Pain management: pain level controlled Vital Signs Assessment: post-procedure vital signs reviewed and stable Respiratory status: spontaneous breathing, nonlabored ventilation and respiratory function stable Cardiovascular status: blood pressure returned to baseline and stable Postop Assessment: no apparent nausea or vomiting Anesthetic complications: no   No notable events documented.  Last Vitals:  Vitals:   10/09/22 1130 10/09/22 1145  BP: 124/65 121/88  Pulse: 93 75  Resp: 14 16  Temp:  36.9 C  SpO2: 95% 95%    Last Pain:  Vitals:   10/09/22 1145  TempSrc:   PainSc: 0-No pain                 Pervis Hocking

## 2022-10-09 NOTE — Anesthesia Procedure Notes (Signed)
Procedure Name: Intubation Date/Time: 10/09/2022 9:29 AM  Performed by: Maryella Shivers, CRNAPre-anesthesia Checklist: Patient identified, Emergency Drugs available, Suction available and Patient being monitored Patient Re-evaluated:Patient Re-evaluated prior to induction Oxygen Delivery Method: Circle system utilized Preoxygenation: Pre-oxygenation with 100% oxygen Induction Type: IV induction Ventilation: Mask ventilation without difficulty Laryngoscope Size: Mac and 4 Grade View: Grade II Tube type: Oral Tube size: 7.5 mm Number of attempts: 1 Airway Equipment and Method: Stylet and Oral airway Placement Confirmation: ETT inserted through vocal cords under direct vision, positive ETCO2 and breath sounds checked- equal and bilateral Secured at: 21 cm Tube secured with: Tape Dental Injury: Teeth and Oropharynx as per pre-operative assessment

## 2022-10-09 NOTE — H&P (Signed)
Cc: Chronic nasal obstruction, left nasal lesion  HPI: The patient is a 37 year old male who presents today with a new complaint of chronic nasal obstruction and a left nasal lesion.  According to the patient, he has been symptomatic for many years.  His nasal obstruction has significantly worsened over the past year.  He has difficulty breathing through his nostrils, especially at night.  He was treated with Flonase nasal spray and allergy medications for years.  However, he continues to be symptomatic.  In addition, he also complains of a lesion within his left nostril.  The lesion has gradually increased in size.  It is nontender to touch.  Exam: General: Communicates without difficulty, well nourished, no acute distress. Head: Normocephalic, no evidence injury, no tenderness, facial buttresses intact without stepoff. Eyes: PERRL, EOMI. No scleral icterus, conjunctivae clear. Neuro: CN II exam reveals vision grossly intact.  No nystagmus at any point of gaze. Ears: Auricles well formed without lesions.  Ear canals are intact without mass or lesion.  No erythema or edema is appreciated.  The TMs are intact without fluid. Nose: External evaluation reveals normal support and skin without lesions.  Dorsum is intact.  Anterior rhinoscopy reveals congested mucosa over anterior aspect of inferior turbinates and intact septum.  No purulence noted. Oral:  Oral cavity and oropharynx are intact, symmetric, without erythema or edema.  Mucosa is moist without lesions. Neck: Full range of motion without pain.  There is no significant lymphadenopathy.  No masses palpable.  Thyroid bed within normal limits to palpation.  Parotid glands and submandibular glands equal bilaterally without mass.  Trachea is midline. Neuro:  CN 2-12 grossly intact. Gait normal. Vestibular: No nystagmus at any point of gaze. The cerebellar examination is unremarkable. A flexible scope was inserted into the right nasal cavity.  Endoscopy of the  interior nasal cavity, superior, inferior, and middle meatus was performed. The sphenoid-ethmoid recess was examined. Edematous mucosa was noted.  Nasal septal deviation noted.  Olfactory cleft was clear.  Nasopharynx was clear.  Turbinates were hypertrophied but without mass. The procedure was repeated on the contralateral side with similar findings.  A papilloma like lesion was noted within the left nostril.  The patient tolerated the procedure well.  Assessment: 1.  Chronic rhinitis with nasal mucosal congestion, nasal septal deviation, and bilateral inferior turbinate hypertrophy.  More than 95% of his nasal passageways are obstructed bilaterally. 2.  The patient has not responded to medical treatment for 3+ years. 3.  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  Plan: 1.  The nasal endoscopy findings are reviewed with the patient. 2.  Continue with Flonase nasal spray 2 sprays each nostril daily. 3.  In light of his persistent symptoms, he may benefit from surgical intervention with septoplasty and bilateral turbinate reduction.  The risk, benefits, and details of the procedures are reviewed. 4.  The patient would like to proceed with the procedures.  In addition, he also would like to have the left nostril lesion excised.

## 2022-10-09 NOTE — Op Note (Signed)
DATE OF PROCEDURE: 10/09/2022  OPERATIVE REPORT   SURGEON: Leta Baptist, MD   PREOPERATIVE DIAGNOSES:  1. Severe nasal septal deviation.  2. Bilateral inferior turbinate hypertrophy.  3. Chronic nasal obstruction. 4. Left nasal mass.  POSTOPERATIVE DIAGNOSES:  1. Severe nasal septal deviation.  2. Bilateral inferior turbinate hypertrophy.  3. Chronic nasal obstruction. 4. Left nasal mass.  PROCEDURE PERFORMED:  1. Septoplasty.  2. Bilateral partial inferior turbinate resection.  3. Excision of left nasal mass.  ANESTHESIA: General endotracheal tube anesthesia.   COMPLICATIONS: None.   ESTIMATED BLOOD LOSS: 175 mL.   INDICATION FOR PROCEDURE: Brady Conley is a 37 y.o. male with a history of chronic nasal obstruction. The patient was treated with antihistamine, decongestant, and steroid nasal sprays. However, the patient continued to be symptomatic. On examination, the patient was noted to have bilateral severe inferior turbinate hypertrophy and significant nasal septal deviation, causing significant nasal obstruction.  In addition, the patient also complained of a soft tissue lesion within his left nasal cavity.  He was noted to have a papilloma like lesion within the left nostril.  Based on the above findings, the decision was made for the patient to undergo the above-stated procedures. The risks, benefits, alternatives, and details of the procedures were discussed with the patient. Questions were invited and answered. Informed consent was obtained.   DESCRIPTION OF PROCEDURE: The patient was taken to the operating room and placed supine on the operating table. General endotracheal tube anesthesia was administered by the anesthesiologist. The patient was positioned, and prepped and draped in the standard fashion for nasal surgery. Pledgets soaked with Afrin were placed in both nasal cavities for decongestion. The pledgets were subsequently removed.   Examination of the nasal cavity  revealed a severe nasal septal deviation. 1% lidocaine with 1:100,000 epinephrine was injected onto the nasal septum bilaterally. A hemitransfixion incision was made on the left side. The mucosal flap was carefully elevated on the left side. A cartilaginous incision was made 1 cm superior to the caudal margin of the nasal septum. Mucosal flap was also elevated on the right side in the similar fashion. It should be noted that due to the severe septal deviation, the deviated portion of the cartilaginous and bony septum had to be removed in piecemeal fashion. Once the deviated portions were removed, a straight midline septum was achieved. The septum was then quilted with 4-0 plain gut sutures. The hemitransfixion incision was closed with interrupted 4-0 chromic sutures.   The inferior one half of both hypertrophied inferior turbinate was crossclamped with a Kelly clamp. The inferior one half of each inferior turbinate was then resected with a pair of cross cutting scissors. Hemostasis was achieved with a suction cautery device.   Attention was then focused on the left nasal soft tissue mass.  A 5 mm papilloma lesion was noted within the left nostril.  An elliptical incision was made around the soft tissue mass.  The entire mass and the subcutaneous tissue were excised.  The entire specimen was sent to the pathology department for permanent histologic identification.  The incision was closed with 4-0 chromic sutures.  The patient was extubated and transferred to the recovery room in good condition.   OPERATIVE FINDINGS: Severe nasal septal deviation and bilateral inferior turbinate hypertrophy.  Left nasal mass.  SPECIMEN: Left nasal mass.  FOLLOWUP CARE: The patient be discharged home once he is awake and alert. The patient will follow up in my office in 3 days for splint removal.  Jarrett Albor Raynelle Bring, MD

## 2022-10-10 ENCOUNTER — Encounter (HOSPITAL_BASED_OUTPATIENT_CLINIC_OR_DEPARTMENT_OTHER): Payer: Self-pay | Admitting: Otolaryngology

## 2022-10-10 LAB — SURGICAL PATHOLOGY

## 2022-10-17 ENCOUNTER — Other Ambulatory Visit (HOSPITAL_COMMUNITY): Payer: Self-pay | Admitting: Family Medicine

## 2022-10-17 ENCOUNTER — Ambulatory Visit (HOSPITAL_COMMUNITY)
Admission: RE | Admit: 2022-10-17 | Discharge: 2022-10-17 | Disposition: A | Discharge status: Home / Self Care | Payer: No Typology Code available for payment source | Source: Ambulatory Visit | Attending: Family Medicine | Admitting: Family Medicine

## 2022-10-17 DIAGNOSIS — R109 Unspecified abdominal pain: Secondary | ICD-10-CM | POA: Diagnosis present

## 2022-10-17 DIAGNOSIS — R319 Hematuria, unspecified: Secondary | ICD-10-CM | POA: Insufficient documentation

## 2023-06-08 ENCOUNTER — Ambulatory Visit: Payer: No Typology Code available for payment source | Admitting: Urology

## 2023-06-08 VITALS — BP 129/67 | HR 77

## 2023-06-08 DIAGNOSIS — Z3009 Encounter for other general counseling and advice on contraception: Secondary | ICD-10-CM | POA: Diagnosis not present

## 2023-06-08 IMAGING — DX DG CHEST 2V
2 series · 2 of 2 positions shown · non-contrast
Comparison: 04/16/2020

CLINICAL DATA: Dry cough for 1 month, fever, shortness of breath
the coughing, history hypertension, asthma, former smoker

EXAM:
CHEST - 2 VIEW

[chest pa]
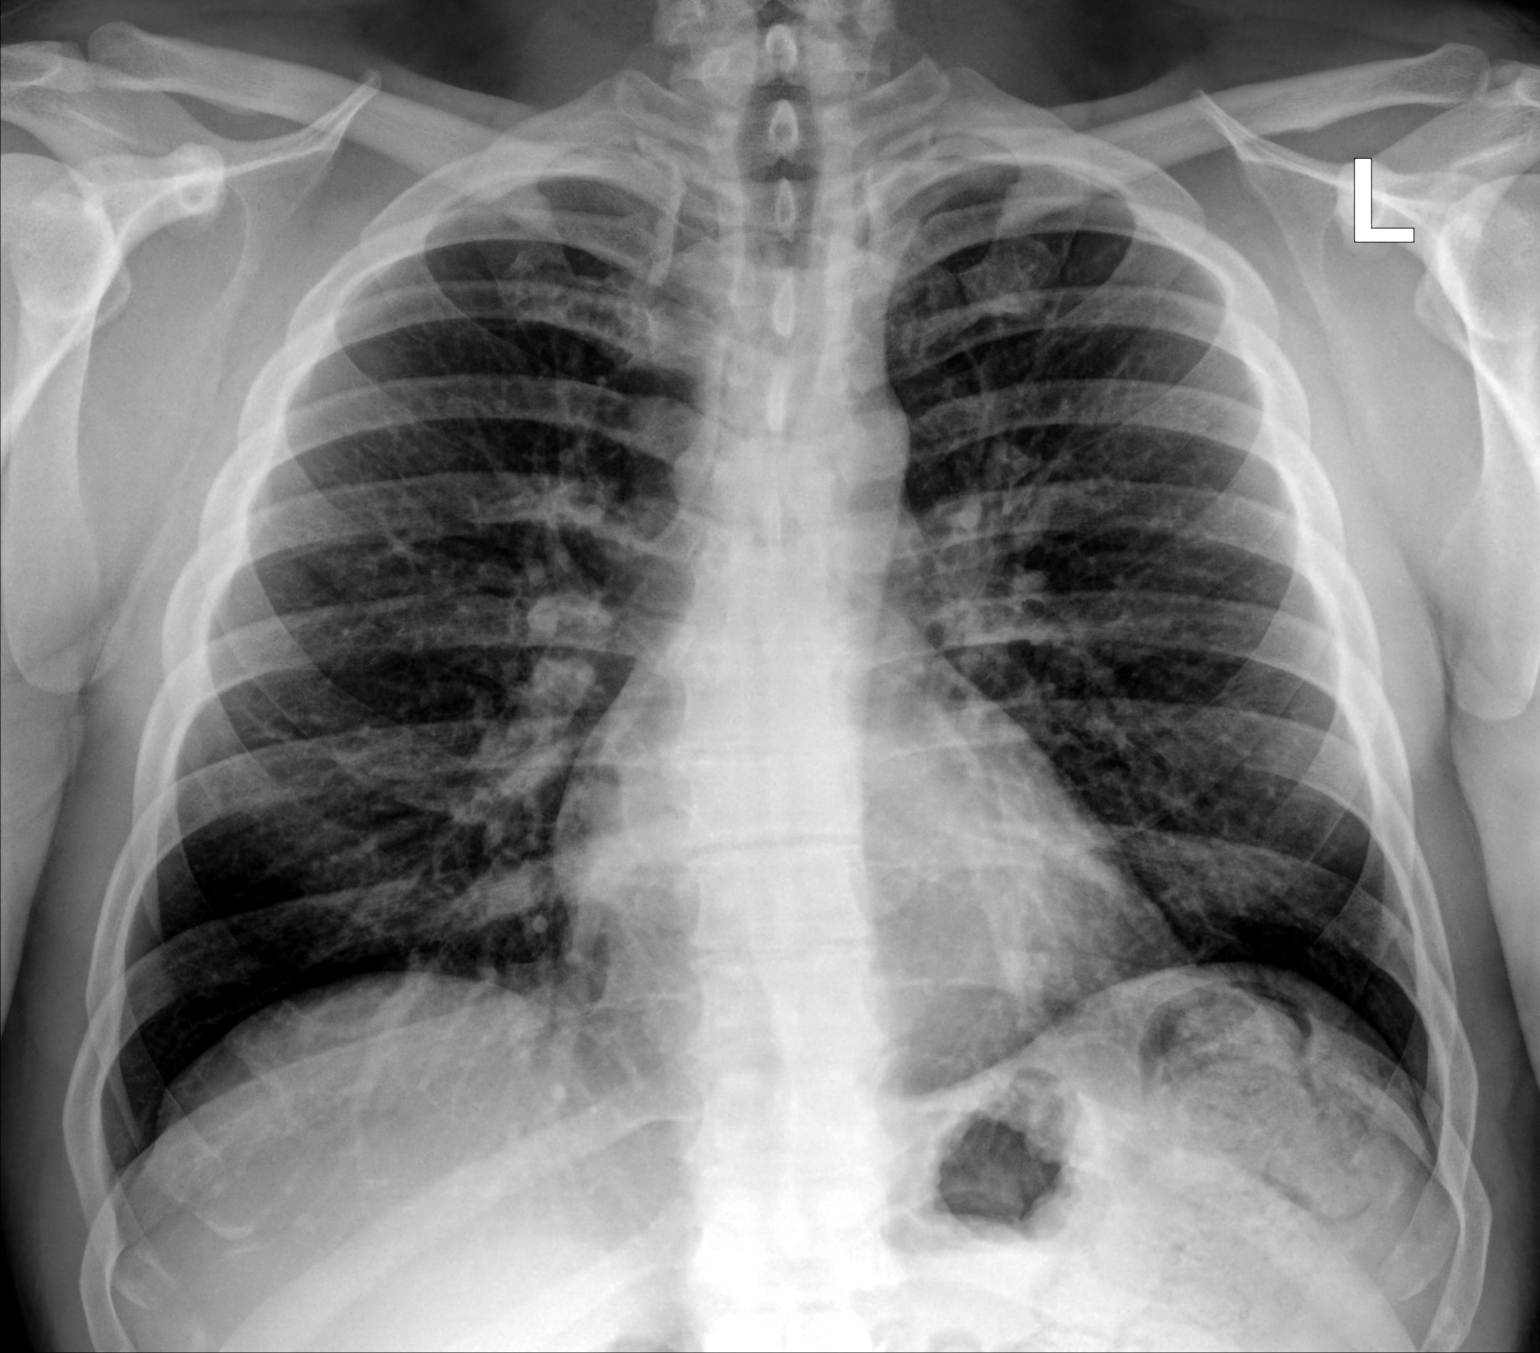

[chest lat]
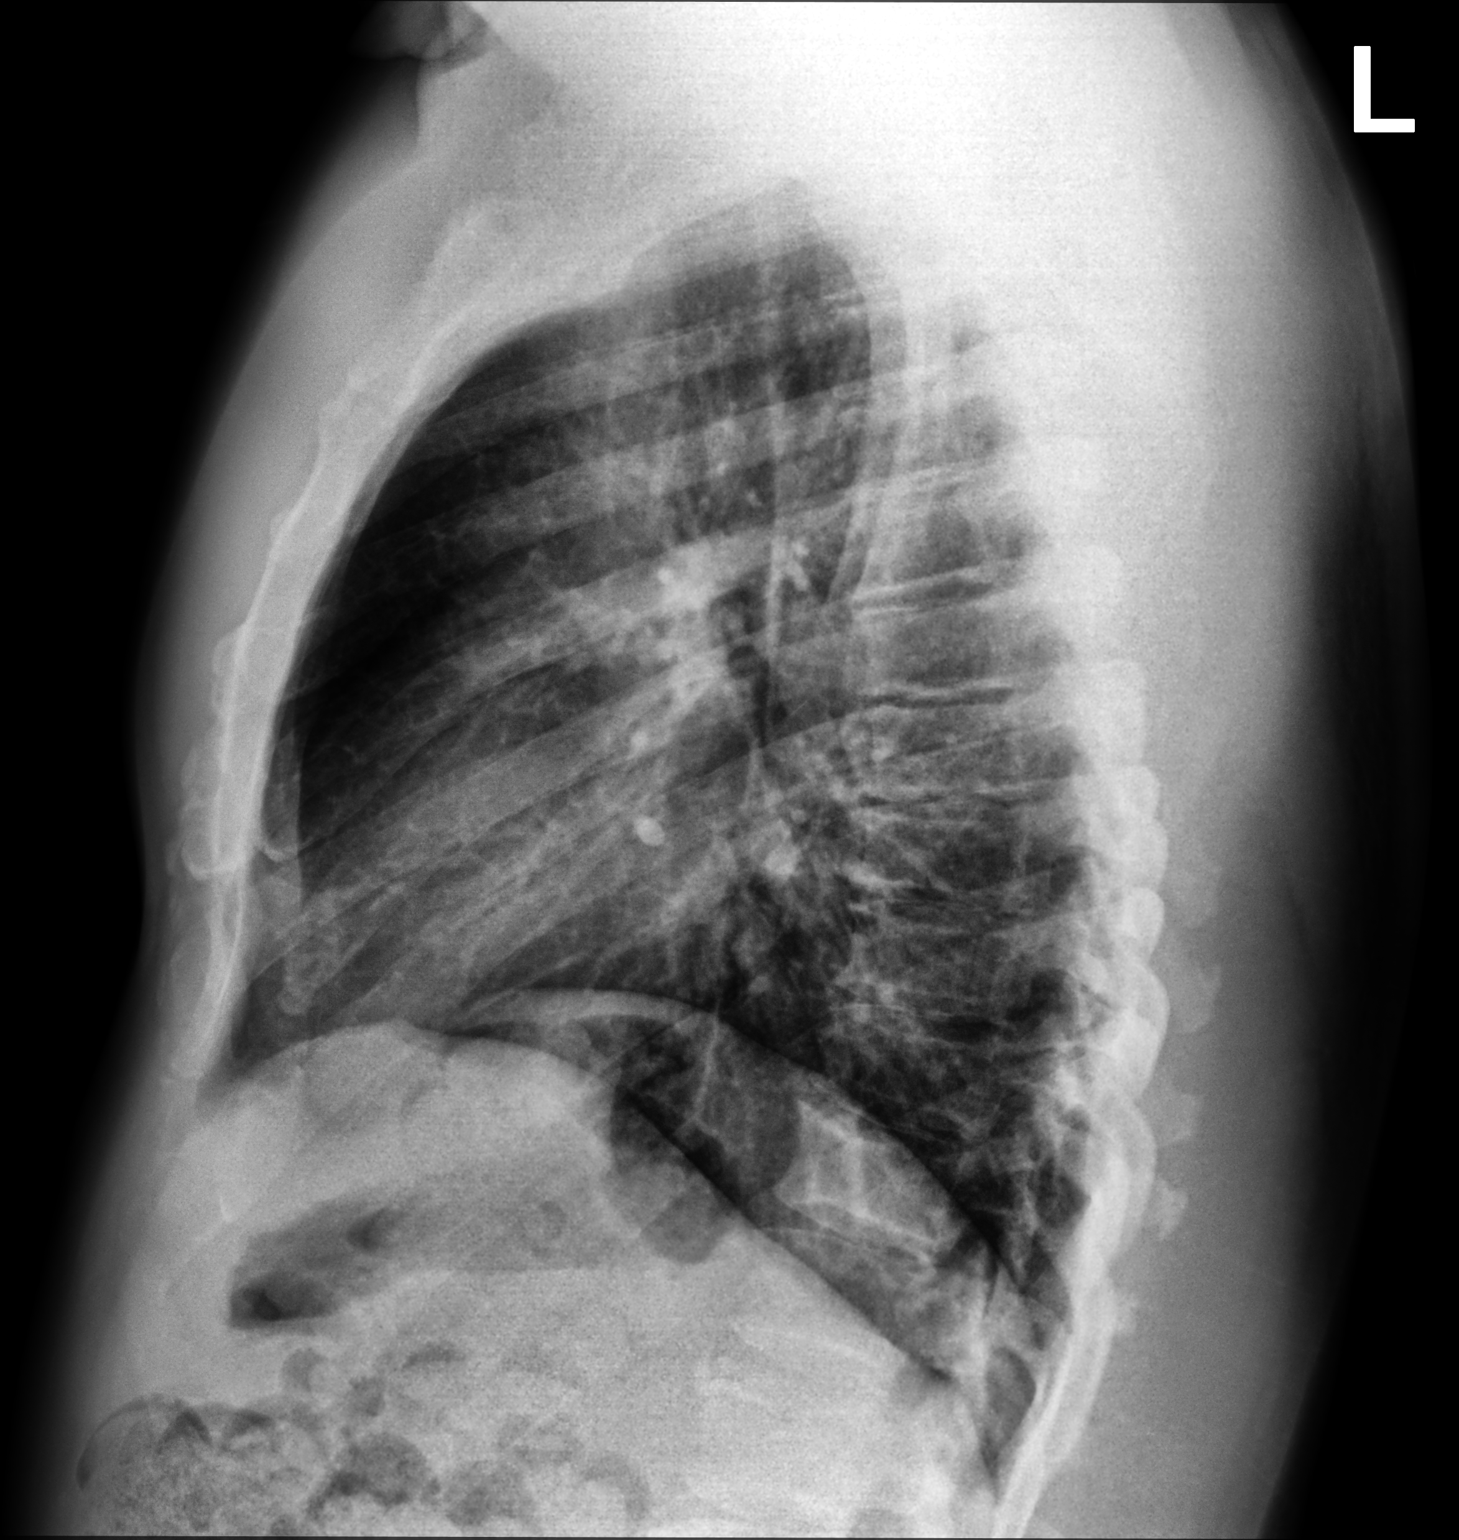

[2 of 2 positions shown; findings below may reference images not displayed]

FINDINGS: Normal heart size, mediastinal contours, and pulmonary vascularity.

Lungs clear.

No pleural effusion or pneumothorax.

Bones unremarkable.
IMPRESSION: No acute abnormalities.

## 2023-06-08 MED ORDER — DIAZEPAM 10 MG PO TABS: 10.0000 mg | ORAL_TABLET | Freq: Once | ORAL | 0 refills | Status: AC

## 2023-06-08 NOTE — Progress Notes (Unsigned)
06/08/2023 12:01 PM   Brady Conley 1985/12/28 [id number]  Referring provider: [facility] [address] [place],  [place] [address]  Vasectomy evaluation   HPI: Mr Brady Conley is a 36yo here for evaluation for vasectomy. No prior scrotal surgery. He is on TRT and his testosterone is around 1000. No issues with erections.    PMH: Past Medical History:  Diagnosis Date   ADD (attention deficit disorder) 06/26/2019   Anxiety    Low testosterone in male 06/26/2019   Sleep apnea     Surgical History: Past Surgical History:  Procedure Laterality Date   EXCISION NASAL MASS Left 10/09/2022   Procedure: EXCISION NASAL MASS;  Surgeon: Brady Pies, MD;  Location: [facility];  Service: ENT;  Laterality: Left;   NASAL SEPTOPLASTY W/ TURBINOPLASTY Bilateral 10/09/2022   Procedure: NASAL SEPTOPLASTY WITH BILATERAL  TURBINATE REDUCTION;  Surgeon: Brady Pies, MD;  Location: [facility];  Service: ENT;  Laterality: Bilateral;   NO PAST SURGERIES      Home Medications:  Allergies as of 06/08/2023   No Known Allergies      Medication List        Accurate as of June 08, 2023 12:01 PM. If you have any questions, ask your nurse or doctor.          albuterol 108 (90 Base) MCG/ACT inhaler Commonly known as: VENTOLIN HFA SMARTSIG:1-2 Puff(s) Via Inhaler Every 4 Hours PRN   ALPRAZolam 0.5 MG tablet Commonly known as: XANAX Take 0.5 mg by mouth at bedtime as needed for anxiety.   amphetamine-dextroamphetamine 5 MG 24 hr capsule Commonly known as: ADDERALL XR Take 5 mg by mouth daily.   anastrozole 1 MG tablet Commonly known as: ARIMIDEX SMARTSIG:0.5 Tablet(s) By Mouth Once a Week   diazepam 2 MG tablet Commonly known as: VALIUM Take 2 mg by mouth every 6 (six) hours as needed for anxiety.   tadalafil 5 MG tablet Commonly known as: CIALIS Take 5 mg by mouth daily.   testosterone cypionate 200 MG/ML  injection Commonly known as: DEPOTESTOSTERONE CYPIONATE INJECT ONE ML INTRAMUSCULARLY TWICE A WEEK.   valACYclovir 1000 MG tablet Commonly known as: VALTREX SMARTSIG:1 Tablet(s) By Mouth Every 12 Hours   Vitamin D3 125 MCG (5000 UT) Tabs Take 2 tablets by mouth daily.        Allergies: No Known Allergies  Family History: No family history on file.  Social History:  reports that he has never smoked. He has never used smokeless tobacco. He reports current alcohol use. He reports that he does not currently use drugs.  ROS: All other review of systems were reviewed and are negative except what is noted above in HPI  Physical Exam: BP 129/67   Pulse 77   Constitutional:  Alert and oriented, No acute distress. HEENT: Brady Conley AT, moist mucus membranes.  Trachea midline, no masses. Cardiovascular: No clubbing, cyanosis, or edema. Respiratory: Normal respiratory effort, no increased work of breathing. GI: Abdomen is soft, nontender, nondistended, no abdominal masses GU: No CVA tenderness. Circumcised phallus. No masses/lesions on penis, testis, scrotum. Bilateral vas deferens palpable Lymph: No cervical or inguinal lymphadenopathy. Skin: No rashes, bruises or suspicious lesions. Neurologic: Grossly intact, no focal deficits, moving all 4 extremities. Psychiatric: Normal mood and affect.  Laboratory Data: No results found for: "WBC", "HGB", "HCT", "MCV", "PLT"  Lab Results  Component Value Date   CREATININE 1.38 (H) 06/26/2019    Lab Results  Component Value Date  PSA 0.6 06/26/2019    No results found for: "TESTOSTERONE"  No results found for: "HGBA1C"  Urinalysis No results found for: "COLORURINE", "APPEARANCEUR", "LABSPEC", "PHURINE", "GLUCOSEU", "HGBUR", "BILIRUBINUR", "KETONESUR", "PROTEINUR", "UROBILINOGEN", "NITRITE", "LEUKOCYTESUR"  No results found for: "LABMICR", "WBCUA", "RBCUA", "LABEPIT", "MUCUS", "BACTERIA"  Pertinent Imaging:  No results found for this  or any previous visit.  No results found for this or any previous visit.  No results found for this or any previous visit.  No results found for this or any previous visit.  No results found for this or any previous visit.  No valid procedures specified. No results found for this or any previous visit.  No results found for this or any previous visit.   Assessment & Plan:    1. Vasectomy evaluation Schedule for vasectomy  Rx for valium sent to pharmacy   No follow-ups on file.  Brady Aye, MD  [facility] [place]

## 2023-06-14 ENCOUNTER — Ambulatory Visit (INDEPENDENT_AMBULATORY_CARE_PROVIDER_SITE_OTHER): Payer: No Typology Code available for payment source | Admitting: Otolaryngology

## 2023-06-14 ENCOUNTER — Encounter: Payer: Self-pay | Admitting: Urology

## 2023-06-14 ENCOUNTER — Encounter (INDEPENDENT_AMBULATORY_CARE_PROVIDER_SITE_OTHER): Payer: Self-pay

## 2023-06-14 VITALS — Ht 67.0 in | Wt 210.0 lb

## 2023-06-14 DIAGNOSIS — J310 Chronic rhinitis: Secondary | ICD-10-CM | POA: Diagnosis not present

## 2023-06-14 DIAGNOSIS — H6983 Other specified disorders of Eustachian tube, bilateral: Secondary | ICD-10-CM

## 2023-06-14 NOTE — Patient Instructions (Signed)
Vasectomy Vasectomy is a procedure to cut and then tie or burn the ends of the vas deferens. The vas deferens is a tube that carries sperm from the testicle to the urethra. This procedure blocks sperm from being released during sex. This ensures that sperm does not go into the vagina. A vasectomy does not affect your ability to have sex or your desire for sex. Also, it does not prevent sexually transmitted infections, or STIs. Vasectomy is a permanent and effective form of birth control. You should have a vasectomy only when you and your partner are sure you do not want children in the future. Do not get this procedure when you are stressed, such as after divorce or pregnancy loss. Tell a health care provider about: Any allergies you have. All medicines you are taking. These include vitamins, herbs, eye drops, creams, and over-the-counter medicines. Any problems you or family members have had with anesthesia. Any bleeding problems you have. Any surgeries you have had. Any medical conditions you have. What are the risks? Your provider will talk with you about risks. These may include: Infection. Bleeding and swelling of the scrotum. The scrotum is the sac that contains the testicles. Allergies to medicines. Failure of the procedure to prevent pregnancy. There is a very small chance that the tied or burned parts of the vas deferens may reconnect. If this happens, you could still make a person pregnant. Pain in the scrotum that goes on after you heal from the procedure. What happens before the procedure? Medicines Ask your health care provider about: Changing or stopping your regular medicines. These include any diabetes medicines or blood thinners you take. Taking medicines such as aspirin and ibuprofen. These medicines can thin your blood. Do not take them unless your provider tells you to take them. Taking over-the-counter medicines, vitamins, herbs, and supplements. You may be told to take a  sedative a few hours before the procedure. A sedative helps you relax. Surgery safety Ask your provider: How your surgery site will be marked. What steps will be taken to help prevent infection. These steps may include: Removing hair at the surgery site. Washing skin with a soap that kills germs. Taking antibiotics. General instructions Do not use any products that contain nicotine or tobacco for at least 4 weeks before the procedure. These products include cigarettes, chewing tobacco, and vaping devices, such as e-cigarettes. If you need help quitting, ask your provider. If you'll be going home right after the procedure, plan to have a responsible adult: Take you home from the hospital or clinic. You'll not be allowed to drive. Care for you for the time you are told. What happens during the procedure?  You may be given: A sedative. This helps you relax. You may also be told to take this a few hours before the procedure. Anesthesia. This keeps you from feeling pain. It will numb certain areas of your body. Your provider will feel for your vas deferens. To get to the vas deferens, your provider may: Make a very small cut, or incision, in your scrotum. Make a hole by piercing the scrotum. Your vas deferens will be pulled out of your scrotum and cut. To close it, the cut ends of the vas deferens will be tied or burned. The vas deferens will be put back into your scrotum. The cut or the hole in the scrotum will be closed with stitches. The stitches will dissolve and will not need to be removed. The procedure will be done  again on the other side of your scrotum. The procedure may vary among providers and hospitals. What happens after the procedure? You will be monitored to make sure that you do not have problems. You will be asked not to ejaculate for at least 1 week after the procedure, or for as long as you are told. You will need to use another form of birth control for 2-4 months after  the procedure. Do this until your provider confirms that there's no sperm in your semen. You may be given something to wear to support your scrotum. This includes a jockstrap or underwear with a pouch. If you were given a sedative during your procedure, do not drive or use machines until your provider says that it's safe. This information is not intended to replace advice given to you by your health care provider. Make sure you discuss any questions you have with your health care provider. Document Revised: 10/10/2022 Document Reviewed: 10/10/2022 Elsevier Patient Education  2024 [employer].

## 2023-06-18 DIAGNOSIS — H6983 Other specified disorders of Eustachian tube, bilateral: Secondary | ICD-10-CM | POA: Insufficient documentation

## 2023-06-18 DIAGNOSIS — J310 Chronic rhinitis: Secondary | ICD-10-CM | POA: Insufficient documentation

## 2023-06-18 NOTE — Progress Notes (Signed)
Patient ID: Brady Conley, male   DOB: 1985/11/14, 37 y.o.   MRN: [id number]  Follow-up: Chronic nasal congestion, eustachian tube dysfunction  HPI: The patient is a 37 year old male who returns today for his follow-up evaluation.  The patient has a history of chronic nasal obstruction.  He was treated with septoplasty and turbinate reduction surgery.  At his last visit 1 month ago, he was also complaining of clogging sensation in his ears, consistent with eustachian tube dysfunction.  He was treated with Flonase and Valsalva exercise.  The patient returns today reporting improvement in his symptoms.  He is able to breathe through both nostrils.  He is now able to auto insufflate his middle ear spaces.  Currently he denies any otalgia, otorrhea, facial pain, or fever.  Exam: General: Communicates without difficulty, well nourished, no acute distress. Head: Normocephalic, no evidence injury, no tenderness, facial buttresses intact without stepoff. Face/sinus: No tenderness to palpation and percussion. Facial movement is normal and symmetric. Eyes: PERRL, EOMI. No scleral icterus, conjunctivae clear. Neuro: CN II exam reveals vision grossly intact.  No nystagmus at any point of gaze. Ears: Auricles well formed without lesions.  Ear canals are intact without mass or lesion.  No erythema or edema is appreciated.  The TMs are intact without fluid. Nose: External evaluation reveals normal support and skin without lesions.  Dorsum is intact.  Anterior rhinoscopy reveals congested mucosa over anterior aspect of inferior turbinates and intact septum.  No purulence noted. Oral:  Oral cavity and oropharynx are intact, symmetric, without erythema or edema.  Mucosa is moist without lesions. Neck: Full range of motion without pain.  There is no significant lymphadenopathy.  No masses palpable.  Thyroid bed within normal limits to palpation.  Parotid glands and submandibular glands equal bilaterally without mass.  Trachea  is midline. Neuro:  CN 2-12 grossly intact.    Assessment: 1.  Chronic rhinitis with nasal mucosal congestion. 2.  The patient's septum and turbinates are well-healed.  His nasal passageways are patent bilaterally. 3.  The patient's eustachian tube dysfunction has clinically improved.  Plan  1.  The physical exam findings are reviewed with the patient.   2.  Continue Flonase nasal spray and Valsalva exercise as needed. 3.  The patient is encouraged to call with any questions or concerns.

## 2023-08-03 ENCOUNTER — Encounter: Payer: No Typology Code available for payment source | Admitting: Urology

## 2023-08-24 ENCOUNTER — Encounter: Payer: No Typology Code available for payment source | Admitting: Urology

## 2023-10-12 ENCOUNTER — Encounter: Payer: Self-pay | Admitting: Allergy & Immunology

## 2023-10-12 ENCOUNTER — Other Ambulatory Visit: Payer: Self-pay

## 2023-10-12 ENCOUNTER — Ambulatory Visit: Payer: No Typology Code available for payment source | Admitting: Allergy & Immunology

## 2023-10-12 VITALS — BP 130/60 | HR 85 | Temp 98.5°F | Wt 219.0 lb

## 2023-10-12 DIAGNOSIS — J310 Chronic rhinitis: Secondary | ICD-10-CM | POA: Diagnosis not present

## 2023-10-12 DIAGNOSIS — J4520 Mild intermittent asthma, uncomplicated: Secondary | ICD-10-CM

## 2023-10-12 DIAGNOSIS — K219 Gastro-esophageal reflux disease without esophagitis: Secondary | ICD-10-CM | POA: Diagnosis not present

## 2023-10-12 DIAGNOSIS — Z8709 Personal history of other diseases of the respiratory system: Secondary | ICD-10-CM

## 2023-10-12 NOTE — Progress Notes (Signed)
 NEW PATIENT  Date of Service/Encounter:  10/12/23  Consult requested by: [facility]   Assessment:   Chronic rhinitis - planning for skin testing at the next visit  History of asthma - with normal spirometry  Plan/Recommendations:   1. Chronic rhinitis - Because of insurance stipulations, we cannot do skin testing on the same day as your first visit. - We are all working to fight this, but for now we need to do two separate visits.  - We will know more after we do testing at the next visit.  - The skin testing visit can be squeezed in at your convenience.  - Then we can make a more full plan to address all of your symptoms. - Be sure to stop your antihistamines for 3 days before this appointment.  - Nasal saline rinse kit provided.  - We can consider doing an immune workup at some point in the future if needed.  2. History of asthma - Lung testing looks excellent today. - I don't think we need to worry about doing lung testing again in the future.   3. Return in about 1 week (around 10/19/2023). You can have the follow up appointment with Dr. Dellis Anes or a Nurse Practicioner (our Nurse Practitioners are excellent and always have Physician oversight!).    This note in its entirety was forwarded to the Provider who requested this consultation.  Subjective:   Brady Conley is a 38 y.o. male presenting today for evaluation of  Chief Complaint  Patient presents with   Nasal Congestion   Sinus Problem    Drainage constant pressure with nose,ears sinus   Cough    Brady Conley has a history of the following: Patient Active Problem List   Diagnosis Date Noted   Chronic rhinitis 06/18/2023   Other specified disorders of eustachian tube, bilateral 06/18/2023   Chronic cough 09/08/2021   ADD (attention deficit disorder) 06/26/2019   Low testosterone in male 06/26/2019    History obtained from: chart review and patient.  Discussed the use of  AI scribe software for clinical note transcription with the patient and/or guardian, who gave verbal consent to proceed.  Brady Conley was referred by [employer], [facility].     Brady Conley is a 38 y.o. male presenting for an evaluation of allergies and asthma .   Asthma/Respiratory Symptom History: He did see Dr. Vassie Loll and the consensus was postnasal drip. He had childhood asthma that cleared up over time. He did have a lot of ED visits when he was younger. He does mountain biking.   Allergic Rhinitis Symptom History: He did have an environmental allergy panel via the blood that was positive to a number of different allergens. He underwent a sinus surgery last year. He has Flonase Sensimist that he uses as needed. He has been able to say on this longer than the regular Flonase. He alternates between cetirizine and loratadine. He did take montelukast; this was ineffective. He grew up in this area and never had testing done. He has had problems for years now with chronic sinusitis. When he does get sick, he will contract a cough that lingers for month.   Skin Symptom History: He does have tinea versicolor and is fluconazole that he will take this when it flares. He can take one dose and notice an improvement from red to white/flakey.  GERD Symptom History: He did have Pepcid for his cough, but never had classic reflux symptoms.   Otherwise, there  is no history of other atopic diseases, including drug allergies, stinging insect allergies, or contact dermatitis. There is no significant infectious history. Vaccinations are up to date.    Past Medical History: Patient Active Problem List   Diagnosis Date Noted   Chronic rhinitis 06/18/2023   Other specified disorders of eustachian tube, bilateral 06/18/2023   Chronic cough 09/08/2021   ADD (attention deficit disorder) 06/26/2019   Low testosterone in male 06/26/2019    Medication List:  Allergies as of 10/12/2023   No Known  Allergies      Medication List        Accurate as of October 12, 2023  4:40 PM. If you have any questions, ask your nurse or doctor.          STOP taking these medications    albuterol 108 (90 Base) MCG/ACT inhaler Commonly known as: VENTOLIN HFA Stopped by: Alfonse Spruce       TAKE these medications    ALPRAZolam 0.5 MG tablet Commonly known as: XANAX Take 0.5 mg by mouth at bedtime as needed for anxiety.   amphetamine-dextroamphetamine 5 MG 24 hr capsule Commonly known as: ADDERALL XR Take 5 mg by mouth daily.   anastrozole 1 MG tablet Commonly known as: ARIMIDEX SMARTSIG:0.5 Tablet(s) By Mouth Once a Week   diazepam 2 MG tablet Commonly known as: VALIUM Take 2 mg by mouth every 6 (six) hours as needed for anxiety.   tadalafil 5 MG tablet Commonly known as: CIALIS Take 5 mg by mouth daily.   testosterone cypionate 200 MG/ML injection Commonly known as: DEPOTESTOSTERONE CYPIONATE INJECT ONE ML INTRAMUSCULARLY TWICE A WEEK.   valACYclovir 1000 MG tablet Commonly known as: VALTREX SMARTSIG:1 Tablet(s) By Mouth Every 12 Hours   Vitamin D3 125 MCG (5000 UT) Tabs Take 2 tablets by mouth daily.        Birth History: non-contributory  Developmental History: non-contributory  Past Surgical History: Past Surgical History:  Procedure Laterality Date   EXCISION NASAL MASS Left 10/09/2022   Procedure: EXCISION NASAL MASS;  Surgeon: Newman Pies, MD;  Location: [facility];  Service: ENT;  Laterality: Left;   NASAL SEPTOPLASTY W/ TURBINOPLASTY Bilateral 10/09/2022   Procedure: NASAL SEPTOPLASTY WITH BILATERAL  TURBINATE REDUCTION;  Surgeon: Newman Pies, MD;  Location: [facility];  Service: ENT;  Laterality: Bilateral;   NO PAST SURGERIES     SINOSCOPY       Family History: History reviewed. No pertinent family history.   Social History: Amato lives at home with his family. He lives in a house that is 39 years old.  There is hardwood flooring throughout the home. They had a heat pump for heating with gas supplement. There is central cooling. There is a dog inside of the home. There are no dust mite coverings on the bedding. There is no tobacco exposure present. 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 He is exposed to fumes, chemicals, and dust. There is a HEPA filter in the home. There do not live near an interstate or industrial area.    Review of systems otherwise negative other than that mentioned in the HPI.    Objective:   Blood pressure 130/60, pulse 85, temperature 98.5 F (36.9 C), weight 219 lb (99.3 kg), SpO2 95%. Body mass index is 34.3 kg/m.     Physical Exam Vitals reviewed.  Constitutional:      Appearance: He is  well-developed.  HENT:     Head: Normocephalic and atraumatic.     Right Ear: Tympanic membrane, ear canal and external ear normal. No drainage, swelling or tenderness. Tympanic membrane is not injected, scarred, erythematous, retracted or bulging.     Left Ear: Tympanic membrane, ear canal and external ear normal. No drainage, swelling or tenderness. Tympanic membrane is not injected, scarred, erythematous, retracted or bulging.     Nose: No nasal deformity, septal deviation, mucosal edema or rhinorrhea.     Right Turbinates: Enlarged, swollen and pale.     Left Turbinates: Enlarged, swollen and pale.     Right Sinus: No maxillary sinus tenderness or frontal sinus tenderness.     Left Sinus: No maxillary sinus tenderness or frontal sinus tenderness.     Mouth/Throat:     Lips: Pink.     Mouth: Mucous membranes are moist. Mucous membranes are not pale and not dry.     Pharynx: Uvula midline.  Eyes:     General: Lids are normal. Allergic shiner present.        Right eye: No discharge.        Left eye: No discharge.     Conjunctiva/sclera: Conjunctivae normal.     Right eye: Right conjunctiva is not injected. No chemosis.     Left eye: Left conjunctiva is not injected. No chemosis.    Pupils: Pupils are equal, round, and reactive to light.  Cardiovascular:     Rate and Rhythm: Normal rate and regular rhythm.     Heart sounds: Normal heart sounds.  Pulmonary:     Effort: Pulmonary effort is normal. No tachypnea, accessory muscle usage or respiratory distress.     Breath sounds: Normal breath sounds. No wheezing, rhonchi or rales.     Comments: Moving air well in all lung fields. No increased work of breathing noted.  Chest:     Chest wall: No tenderness.  Abdominal:     Tenderness: There is no abdominal tenderness. There is no guarding or rebound.  Lymphadenopathy:     Head:     Right side of head: No submandibular, tonsillar or occipital adenopathy.     Left side of head: No submandibular, tonsillar or occipital adenopathy.     Cervical: No cervical adenopathy.  Skin:    Coloration: Skin is not pale.     Findings: No abrasion, erythema, petechiae or rash. Rash is not papular, urticarial or vesicular.  Neurological:     Mental Status: He is alert.  Psychiatric:        Behavior: Behavior is cooperative.      Diagnostic studies:    Spirometry: results normal (FEV1: 3.79/96%, FVC: 4.44/92%, FEV1/FVC: 85%).    Spirometry consistent with normal pattern.    Allergy Studies: none          Malachi Bonds, MD [facility]

## 2023-10-12 NOTE — Patient Instructions (Addendum)
 1. Chronic rhinitis - Because of insurance stipulations, we cannot do skin testing on the same day as your first visit. - We are all working to fight this, but for now we need to do two separate visits.  - We will know more after we do testing at the next visit.  - The skin testing visit can be squeezed in at your convenience.  - Then we can make a more full plan to address all of your symptoms. - Be sure to stop your antihistamines for 3 days before this appointment.  - Nasal saline rinse kit provided.  - We can consider doing an immune workup at some point in the future if needed.  2. History of asthma - Lung testing looks excellent today. - I don't think we need to worry about doing lung testing again in the future.   3. Return in about 1 week (around 10/19/2023). You can have the follow up appointment with Dr. Dellis Anes or a Nurse Practicioner (our Nurse Practitioners are excellent and always have Physician oversight!).    Please inform us of any Emergency Department visits, hospitalizations, or changes in symptoms. Call us before going to the ED for breathing or allergy symptoms since we might be able to fit you in for a sick visit. Feel free to contact us anytime with any questions, problems, or concerns.  It was a pleasure to meet you today!  Websites that have reliable patient information: 1. American Academy of Asthma, Allergy, and Immunology: www.aaaai.org 2. Food Allergy Research and Education (FARE): foodallergy.org 3. Mothers of Asthmatics: http://www.asthmacommunitynetwork.org 4. American College of Allergy, Asthma, and Immunology: www.acaai.org      "Like" [place] on Facebook and Instagram for our latest updates!      A healthy democracy works best when [employer] participate! Make sure you are registered to vote! If you have moved or changed any of your contact information, you will need to get this updated before voting! Scan the QR codes below to learn more!       You can buy saline nose drops at a pharmacy, or you can make your own saline solution:  1. Add 1 cup (240 mL) distilled water to a clean container. If you use tap water, boil it first to sterilize it, and then let it cool until it is lukewarm.  2. Add 0.5 tsp (2.5 g) salt to the water. 3. Add 0.5 tsp (2.5 g) baking soda.

## 2023-10-15 ENCOUNTER — Encounter: Payer: Self-pay | Admitting: Allergy & Immunology

## 2023-10-24 ENCOUNTER — Encounter: Payer: Self-pay | Admitting: Allergy & Immunology

## 2023-10-24 ENCOUNTER — Ambulatory Visit (INDEPENDENT_AMBULATORY_CARE_PROVIDER_SITE_OTHER): Payer: No Typology Code available for payment source | Admitting: Allergy & Immunology

## 2023-10-24 DIAGNOSIS — J4520 Mild intermittent asthma, uncomplicated: Secondary | ICD-10-CM

## 2023-10-24 DIAGNOSIS — K219 Gastro-esophageal reflux disease without esophagitis: Secondary | ICD-10-CM

## 2023-10-24 DIAGNOSIS — J302 Other seasonal allergic rhinitis: Secondary | ICD-10-CM | POA: Diagnosis not present

## 2023-10-24 DIAGNOSIS — J3089 Other allergic rhinitis: Secondary | ICD-10-CM

## 2023-10-24 MED ORDER — RYALTRIS 665-25 MCG/ACT NA SUSP: NASAL | 5 refills | Status: DC

## 2023-10-24 NOTE — Progress Notes (Signed)
 FOLLOW UP  Date of Service/Encounter:  10/24/23   Assessment:   History of asthma - with normal spiro at the last visit   Seasonal and perennial allergic rhinitis (grasses, weeds, trees, indoor molds, outdoor molds, dust mites, dog, and cockroach)  Gastroesophageal reflux disease  Singulair non-responder  Plan/Recommendations:   1. Chronic rhinitis - Testing today showed: grasses, weeds, trees, indoor molds, outdoor molds, dust mites, dog, and cockroach - Copy of test results provided.  - Avoidance measures provided. - Stop taking: Flonase for now - Continue with: alternating antihistamines as you are doing up to twice daily - Start taking: Ryaltris (olopatadine/mometasone) two sprays per nostril 1-2 times daily as needed - Sample provided.  - You can use an extra dose of the antihistamine, if needed, for breakthrough symptoms.  - Consider nasal saline rinses 1-2 times daily to remove allergens from the nasal cavities as well as help with mucous clearance (this is especially helpful to do before the nasal sprays are given) - STRONGLY consider allergy shots as a means of long-term control. - Allergy shots "re-train" and "reset" the immune system to ignore environmental allergens and decrease the resulting immune response to those allergens (sneezing, itchy watery eyes, runny nose, nasal congestion, etc).    - Allergy shots improve symptoms in 75-85% of patients.  - We can discuss more at the next appointment if the medications are not working for you.  2. History of asthma - Lung testing looked fine last time. - I don't think we need to worry about doing lung testing again in the future.   3. Return in about 2 months (around 12/24/2023). You can have the follow up appointment with Dr. Dellis Anes or a Nurse Practicioner (our Nurse Practitioners are excellent and always have Physician oversight!).   Subjective:   Brady Conley is a 38 y.o. male presenting today for follow up  of No chief complaint on file.   Brady Conley has a history of the following: Patient Active Problem List   Diagnosis Date Noted   Chronic rhinitis 06/18/2023   Other specified disorders of eustachian tube, bilateral 06/18/2023   Chronic cough 09/08/2021   ADD (attention deficit disorder) 06/26/2019   Low testosterone in male 06/26/2019    History obtained from: chart review and patient.  Discussed the use of AI scribe software for clinical note transcription with the patient and/or guardian, who gave verbal consent to proceed.  Brady Conley is a 38 y.o. male presenting for skin testing. He was last seen on February 21st. We could not do testing because his insurance company does not cover testing on the same day as a New Patient visit. He has been off of all antihistamines 3 days in anticipation of the testing.   Lung testing was normal. We decided to do allergy testing to more fully understand his symptoms and it's causes. He has been alternating antihistamines and has been using his nose spray. He has been on Singulair in the past and has not felt any better.   Otherwise, there have been no changes to his past medical history, surgical history, family history, or social history.    Review of systems otherwise negative other than that mentioned in the HPI.    Objective:   There were no vitals taken for this visit. There is no height or weight on file to calculate BMI.    Physical exam deferred since this was a skin testing appointment only.   Diagnostic studies:   Allergy Studies:  Airborne Adult Perc - 10/24/23 0931     Time Antigen Placed 0931    Allergen Manufacturer Waynette Buttery    Location Back    Number of Test 55    1. Control-Buffer 50% Glycerol Negative    2. Control-Histamine 2+    3. Bahia 2+    4. [place] Negative    5. Johnson 2+    6. Kentucky Blue 2+    7. Meadow Fescue Negative    8. Perennial Rye Negative    9. Timothy Negative    10. Ragweed Mix  Negative    11. Cocklebur Negative    12. Plantain,  English Negative    13. Baccharis Negative    14. Dog Fennel Negative    15. Russian Thistle Negative    16. Lamb's Quarters 2+    17. Sheep Sorrell 2+    18. Rough Pigweed 2+    19. Marsh Elder, Rough Negative    20. Mugwort, Common Negative    21. Box, Elder 2+    22. Cedar, red Negative    23. Sweet Gum Negative    24. Pecan Pollen 2+    25. Pine Mix Negative    26. Walnut, Black Pollen 2+    27. Red Mulberry Negative    28. Ash Mix Negative    29. Birch Mix Negative    30. Beech American 3+    31. Cottonwood, [place] 2+    32. Hickory, White Negative    33. Maple Mix Negative    34. Oak, [place] Mix 3+    35. Sycamore Eastern Negative    36. Alternaria Alternata Negative    37. Cladosporium Herbarum Negative    38. Aspergillus Mix Negative    39. Penicillium Mix Negative    40. Bipolaris Sorokiniana (Helminthosporium) Negative    41. Drechslera Spicifera (Curvularia) Negative    42. Mucor Plumbeus Negative    43. Fusarium Moniliforme Negative    44. Aureobasidium Pullulans (pullulara) Negative    45. Rhizopus Oryzae Negative    46. Botrytis Cinera Negative    47. Epicoccum Nigrum Negative    48. Phoma Betae Negative    49. Dust Mite Mix Negative    50. Cat Hair 10,000 BAU/ml Negative    51.  Dog Epithelia Negative    52. Mixed Feathers Negative    53. Horse Epithelia Negative    54. Cockroach, German Negative    55. Tobacco Leaf Negative             Intradermal - 10/24/23 0955     Time Antigen Placed 1000    Allergen Manufacturer Waynette Buttery    Location Arm    Number of Test 9    Control Negative    Mold 1 Negative    Mold 2 2+    Mold 3 Negative    Mold 4 4+    Mite Mix 4+    Cat Negative    Dog 3+    Cockroach 3+             Allergy testing results were read and interpreted by myself, documented by clinical staff.      Malachi Bonds, MD  [facility]

## 2023-10-24 NOTE — Patient Instructions (Addendum)
 1. Chronic rhinitis - Testing today showed: grasses, weeds, trees, indoor molds, outdoor molds, dust mites, dog, and cockroach - Copy of test results provided.  - Avoidance measures provided. - Stop taking: Flonase for now - Continue with: alternating antihistamines as you are doing up to twice daily - Start taking: Ryaltris (olopatadine/mometasone) two sprays per nostril 1-2 times daily as needed - Sample provided.  - You can use an extra dose of the antihistamine, if needed, for breakthrough symptoms.  - Consider nasal saline rinses 1-2 times daily to remove allergens from the nasal cavities as well as help with mucous clearance (this is especially helpful to do before the nasal sprays are given) - STRONGLY consider allergy shots as a means of long-term control. - Allergy shots "re-train" and "reset" the immune system to ignore environmental allergens and decrease the resulting immune response to those allergens (sneezing, itchy watery eyes, runny nose, nasal congestion, etc).    - Allergy shots improve symptoms in 75-85% of patients.  - We can discuss more at the next appointment if the medications are not working for you.  2. History of asthma - Lung testing looked fine last time. - I don't think we need to worry about doing lung testing again in the future.   3. Return in about 2 months (around 12/24/2023). You can have the follow up appointment with Dr. Dellis Anes or a Nurse Practicioner (our Nurse Practitioners are excellent and always have Physician oversight!).    Please inform us of any Emergency Department visits, hospitalizations, or changes in symptoms. Call us before going to the ED for breathing or allergy symptoms since we might be able to fit you in for a sick visit. Feel free to contact us anytime with any questions, problems, or concerns.  It was a pleasure to meet you today!  Websites that have reliable patient information: 1. American Academy of Asthma, Allergy, and  Immunology: www.aaaai.org 2. Food Allergy Research and Education (FARE): foodallergy.org 3. Mothers of Asthmatics: http://www.asthmacommunitynetwork.org 4. American College of Allergy, Asthma, and Immunology: www.acaai.org      "Like" [place] on Facebook and Instagram for our latest updates!      A healthy democracy works best when [employer] participate! Make sure you are registered to vote! If you have moved or changed any of your contact information, you will need to get this updated before voting! Scan the QR codes below to learn more!     Airborne Adult Perc - 10/24/23 0931     Time Antigen Placed 0931    Allergen Manufacturer Waynette Buttery    Location Back    Number of Test 55    1. Control-Buffer 50% Glycerol Negative    2. Control-Histamine 2+    3. [place] 2+    4. [place] Negative    5. Johnson 2+    6. [place] Blue 2+    7. Meadow Fescue Negative    8. Perennial Rye Negative    9. Timothy Negative    10. Ragweed Mix Negative    11. Cocklebur Negative    12. Plantain,  English Negative    13. Baccharis Negative    14. Dog Fennel Negative    15. Russian Thistle Negative    16. Lamb's Quarters 2+    17. Sheep Sorrell 2+    18. Rough Pigweed 2+    19. Marsh Elder, Rough Negative    20. Mugwort, Common Negative    21. Box, Elder 2+    22. Cyd Silence,  red Negative    23. Sweet Gum Negative    24. Pecan Pollen 2+    25. Pine Mix Negative    26. Walnut, Black Pollen 2+    27. Red Mulberry Negative    28. Ash Mix Negative    29. Birch Mix Negative    30. Beech American 3+    31. Cottonwood, [place] 2+    32. Hickory, White Negative    33. Maple Mix Negative    34. Oak, [place] Mix 3+    35. Sycamore Eastern Negative    36. Alternaria Alternata Negative    37. Cladosporium Herbarum Negative    38. Aspergillus Mix Negative    39. Penicillium Mix Negative    40. Bipolaris Sorokiniana (Helminthosporium) Negative    41. Drechslera Spicifera (Curvularia) Negative    42. Mucor  Plumbeus Negative    43. Fusarium Moniliforme Negative    44. Aureobasidium Pullulans (pullulara) Negative    45. Rhizopus Oryzae Negative    46. Botrytis Cinera Negative    47. Epicoccum Nigrum Negative    48. Phoma Betae Negative    49. Dust Mite Mix Negative    50. Cat Hair 10,000 BAU/ml Negative    51.  Dog Epithelia Negative    52. Mixed Feathers Negative    53. Horse Epithelia Negative    54. Cockroach, German Negative    55. Tobacco Leaf Negative             Intradermal - 10/24/23 0955     Time Antigen Placed 1000    Allergen Manufacturer Waynette Buttery    Location Arm    Number of Test 9    Control Negative    Mold 1 Negative    Mold 2 2+    Mold 3 Negative    Mold 4 4+    Mite Mix 4+    Cat Negative    Dog 3+    Cockroach 3+             Reducing Pollen Exposure  The American Academy of Allergy, Asthma and Immunology suggests the following steps to reduce your exposure to pollen during allergy seasons.    Do not hang sheets or clothing out to dry; pollen may collect on these items. Do not mow lawns or spend time around freshly cut grass; mowing stirs up pollen. Keep windows closed at night.  Keep car windows closed while driving. Minimize morning activities outdoors, a time when pollen counts are usually at their highest. Stay indoors as much as possible when pollen counts or humidity is high and on windy days when pollen tends to remain in the air longer. Use air conditioning when possible.  Many air conditioners have filters that trap the pollen spores. Use a HEPA room air filter to remove pollen form the indoor air you breathe.  Control of Mold Allergen   Mold and fungi can grow on a variety of surfaces provided certain temperature and moisture conditions exist.  Outdoor molds grow on plants, decaying vegetation and soil.  The major outdoor mold, Alternaria and Cladosporium, are found in very high numbers during hot and dry conditions.  Generally, a late  Summer - Fall peak is seen for common outdoor fungal spores.  Rain will temporarily lower outdoor mold spore count, but counts rise rapidly when the rainy period ends.  The most important indoor molds are Aspergillus and Penicillium.  Dark, humid and poorly ventilated basements are ideal sites for mold growth.  The  next most common sites of mold growth are the bathroom and the kitchen.   Indoor (Perennial) Mold Control   Positive indoor molds via skin testing: Aspergillus, Penicillium, Fusarium, Aureobasidium (Pullulara), and Rhizopus  Maintain humidity below 50%. Clean washable surfaces with 5% bleach solution. Remove sources e.g. contaminated carpets.    Control of Dust Mite Allergen    Dust mites play a major role in allergic asthma and rhinitis.  They occur in environments with high humidity wherever human skin is found.  Dust mites absorb humidity from the atmosphere (ie, they do not drink) and feed on organic matter (including shed human and animal skin).  Dust mites are a microscopic type of insect that you cannot see with the naked eye.  High levels of dust mites have been detected from mattresses, pillows, carpets, upholstered furniture, bed covers, clothes, soft toys and any woven material.  The principal allergen of the dust mite is found in its feces.  A gram of dust may contain 1,000 mites and 250,000 fecal particles.  Mite antigen is easily measured in the air during house cleaning activities.  Dust mites do not bite and do not cause harm to humans, other than by triggering allergies/asthma.    Ways to decrease your exposure to dust mites in your home:  Encase mattresses, box springs and pillows with a mite-impermeable barrier or cover   Wash sheets, blankets and drapes weekly in hot water (130 F) with detergent and dry them in a dryer on the hot setting.  Have the room cleaned frequently with a vacuum cleaner and a damp dust-mop.  For carpeting or rugs, vacuuming with a vacuum  cleaner equipped with a high-efficiency particulate air (HEPA) filter.  The dust mite allergic individual should not be in a room which is being cleaned and should wait 1 hour after cleaning before going into the room. Do not sleep on upholstered furniture (eg, couches).   If possible removing carpeting, upholstered furniture and drapery from the home is ideal.  Horizontal blinds should be eliminated in the rooms where the person spends the most time (bedroom, study, television room).  Washable vinyl, roller-type shades are optimal. Remove all non-washable stuffed toys from the bedroom.  Wash stuffed toys weekly like sheets and blankets above.   Reduce indoor humidity to less than 50%.  Inexpensive humidity monitors can be purchased at most hardware stores.  Do not use a humidifier as can make the problem worse and are not recommended.   Control of Dog or Cat Allergen  Avoidance is the best way to manage a dog or cat allergy. If you have a dog or cat and are allergic to dog or cats, consider removing the dog or cat from the home. If you have a dog or cat but don't want to find it a new home, or if your family wants a pet even though someone in the household is allergic, here are some strategies that may help keep symptoms at bay:  Keep the pet out of your bedroom and restrict it to only a few rooms. Be advised that keeping the dog or cat in only one room will not limit the allergens to that room. Don't pet, hug or kiss the dog or cat; if you do, wash your hands with soap and water. High-efficiency particulate air (HEPA) cleaners run continuously in a bedroom or living room can reduce allergen levels over time. Regular use of a high-efficiency vacuum cleaner or a central vacuum can reduce allergen levels.  Giving your dog or cat a bath at least once a week can reduce airborne allergen.  Control of Cockroach Allergen  Cockroach allergen has been identified as an important cause of acute attacks of  asthma, especially in urban settings.  There are fifty-five species of cockroach that exist in the [place], however only three, the [place], [place] species produce allergen that can affect patients with Asthma.  Allergens can be obtained from fecal particles, egg casings and secretions from cockroaches.    Remove food sources. Reduce access to water. Seal access and entry points. Spray runways with 0.5-1% Diazinon or Chlorpyrifos Blow boric acid power under stoves and refrigerator. Place bait stations (hydramethylnon) at feeding sites.  Allergy Shots  Allergies are the result of a chain reaction that starts in the immune system. Your immune system controls how your body defends itself. For instance, if you have an allergy to pollen, your immune system identifies pollen as an invader or allergen. Your immune system overreacts by producing antibodies called Immunoglobulin E (IgE). These antibodies travel to cells that release chemicals, causing an allergic reaction.  The concept behind allergy immunotherapy, whether it is received in the form of shots or tablets, is that the immune system can be desensitized to specific allergens that trigger allergy symptoms. Although it requires time and patience, the payback can be long-term relief. Allergy injections contain a dilute solution of those substances that you are allergic to based upon your skin testing and allergy history.   How Do Allergy Shots Work?  Allergy shots work much like a vaccine. Your body responds to injected amounts of a particular allergen given in increasing doses, eventually developing a resistance and tolerance to it. Allergy shots can lead to decreased, minimal or no allergy symptoms.  There generally are two phases: build-up and maintenance. Build-up often ranges from three to six months and involves receiving injections with increasing amounts of the allergens. The shots are typically given once or twice a  week, though more rapid build-up schedules are sometimes used.  The maintenance phase begins when the most effective dose is reached. This dose is different for each person, depending on how allergic you are and your response to the build-up injections. Once the maintenance dose is reached, there are longer periods between injections, typically two to four weeks.  Occasionally doctors give cortisone-type shots that can temporarily reduce allergy symptoms. These types of shots are different and should not be confused with allergy immunotherapy shots.  Who Can Be Treated with Allergy Shots?  Allergy shots may be a good treatment approach for people with allergic rhinitis (hay fever), allergic asthma, conjunctivitis (eye allergy) or stinging insect allergy.   Before deciding to begin allergy shots, you should consider:   The length of allergy season and the severity of your symptoms  Whether medications and/or changes to your environment can control your symptoms  Your desire to avoid long-term medication use  Time: allergy immunotherapy requires a major time commitment  Cost: may vary depending on your insurance coverage  Allergy shots for children age 41 and older are effective and often well tolerated. They might prevent the onset of new allergen sensitivities or the progression to asthma.  Allergy shots are not started on patients who are pregnant but can be continued on patients who become pregnant while receiving them. In some patients with other medical conditions or who take certain common medications, allergy shots may be of risk. It is important to mention other medications  you talk to your allergist.   What are the two types of build-ups offered:   RUSH or Rapid Desensitization -- one day of injections lasting from 8:30-4:30pm, injections every 1 hour.  Approximately half of the build-up process is completed in that one day.  The following week, normal build-up is resumed, and this  entails ~16 visits either weekly or twice weekly, until reaching your "maintenance dose" which is continued weekly until eventually getting spaced out to every month for a duration of 3 to 5 years. The regular build-up appointments are nurse visits where the injections are administered, followed by required monitoring for 30 minutes.    Traditional build-up -- weekly visits for 6 -12 months until reaching "maintenance dose", then continue weekly until eventually spacing out to every 4 weeks as above. At these appointments, the injections are administered, followed by required monitoring for 30 minutes.     Either way is acceptable, and both are equally effective. With the rush protocol, the advantage is that less time is spent here for injections overall AND you would also reach maintenance dosing faster (which is when the clinical benefit starts to become more apparent). Not everyone is a candidate for rapid desensitization.   IF we proceed with the RUSH protocol, there are premedications which must be taken the day before and the day after the rush only (this includes antihistamines, steroids, and Singulair).  After the rush day, no prednisone or Singulair is required, and we just recommend antihistamines taken on your injection day.  What Is An Estimate of the Costs?  If you are interested in starting allergy injections, please check with your insurance company about your coverage for both allergy vial sets and allergy injections.  Please do so prior to making the appointment to start injections.  The following are CPT codes to give to your insurance company. These are the amounts we BILL to the insurance company, but the amount YOU WILL PAY and WE RECEIVE IS SUBSTANTIALLY LESS and depends on the contracts we have with different insurance companies.   Amount Billed to Insurance Two allergy vial set  CPT 95165   $ 2400     Two injections   CPT 95117   $ 40 RUSH (Rapid Desensitization) CPT 95180 x 8  hours  $500/hour  Regarding the allergy injections, your co-pay may or may not apply with each injection, so please confirm this with your insurance company. When you start allergy injections, 1 or 2 sets of vials are made based on your allergies.  Not all patients can be on one set of vials. A set of vials lasts 6 months to a year depending on how quickly you can proceed with your build-up of your allergy injections. Vials are personalized for each patient depending on their specific allergens.  How often are allergy injection given during the build-up period?   Injections are given at least weekly during the build-up period until your maintenance dose is achieved. Per the doctor's discretion, you may have the option of getting allergy injections two times per week during the build-up period. However, there must be at least 48 hours between injections. The build-up period is usually completed within 6-12 months depending on your ability to schedule injections and for adjustments for reactions. When maintenance dose is reached, your injection schedule is gradually changed to every two weeks and later to every three weeks. Injections will then continue every 4 weeks. Usually, injections are continued for a total of 3-5 years.  When Will I Feel Better?  Some may experience decreased allergy symptoms during the build-up phase. For others, it may take as long as 12 months on the maintenance dose. If there is no improvement after a year of maintenance, your allergist will discuss other treatment options with you.  If you aren't responding to allergy shots, it may be because there is not enough dose of the allergen in your vaccine or there are missing allergens that were not identified during your allergy testing. Other reasons could be that there are high levels of the allergen in your environment or major exposure to non-allergic triggers like tobacco smoke.  What Is the Length of Treatment?  Once the  maintenance dose is reached, allergy shots are generally continued for three to five years. The decision to stop should be discussed with your allergist at that time. Some people may experience a permanent reduction of allergy symptoms. Others may relapse and a longer course of allergy shots can be considered.  What Are the Possible Reactions?  The two types of adverse reactions that can occur with allergy shots are local and systemic. Common local reactions include very mild redness and swelling at the injection site, which can happen immediately or several hours after. Report a delayed reaction from your last injection. These include arm swelling or runny nose, watery eyes or cough that occurs within 12-24 hours after injection. A systemic reaction, which is less common, affects the entire body or a particular body system. They are usually mild and typically respond quickly to medications. Signs include increased allergy symptoms such as sneezing, a stuffy nose or hives.   Rarely, a serious systemic reaction called anaphylaxis can develop. Symptoms include swelling in the throat, wheezing, a feeling of tightness in the chest, nausea or dizziness. Most serious systemic reactions develop within 30 minutes of allergy shots. This is why it is strongly recommended you wait in your doctor's office for 30 minutes after your injections. Your allergist is trained to watch for reactions, and his or her staff is trained and equipped with the proper medications to identify and treat them.   Report to the nurse immediately if you experience any of the following symptoms: swelling, itching or redness of the skin, hives, watery eyes/nose, breathing difficulty, excessive sneezing, coughing, stomach pain, diarrhea, or light headedness. These symptoms may occur within 15-20 minutes after injection and may require medication.   Who Should Administer Allergy Shots?  The preferred location for receiving shots is your  prescribing allergist's office. Injections can sometimes be given at another facility where the physician and staff are trained to recognize and treat reactions, and have received instructions by your prescribing allergist.  What if I am late for an injection?   Injection dose will be adjusted depending upon how many days or weeks you are late for your injection.   What if I am sick?   Please report any illness to the nurse before receiving injections. She may adjust your dose or postpone injections depending on your symptoms. If you have fever, flu, sinus infection or chest congestion it is best to postpone allergy injections until you are better. Never get an allergy injection if your asthma is causing you problems. If your symptoms persist, seek out medical care to get your health problem under control.  What If I am or Become Pregnant:  Women that become pregnant should schedule an appointment with [facility] before receiving any further allergy injections.

## 2023-10-24 NOTE — Addendum Note (Signed)
 Addended by: Elsworth Soho on: 10/24/2023 02:32 PM   Modules accepted: Orders

## 2023-11-02 ENCOUNTER — Encounter: Payer: No Typology Code available for payment source | Admitting: Urology

## 2023-12-26 ENCOUNTER — Ambulatory Visit (INDEPENDENT_AMBULATORY_CARE_PROVIDER_SITE_OTHER): Admitting: Allergy & Immunology

## 2023-12-26 ENCOUNTER — Encounter: Payer: Self-pay | Admitting: Allergy & Immunology

## 2023-12-26 VITALS — BP 130/84 | HR 76 | Temp 98.6°F | Resp 16 | Wt 226.2 lb

## 2023-12-26 DIAGNOSIS — J3089 Other allergic rhinitis: Secondary | ICD-10-CM | POA: Diagnosis not present

## 2023-12-26 DIAGNOSIS — J4520 Mild intermittent asthma, uncomplicated: Secondary | ICD-10-CM | POA: Diagnosis not present

## 2023-12-26 DIAGNOSIS — J302 Other seasonal allergic rhinitis: Secondary | ICD-10-CM

## 2023-12-26 DIAGNOSIS — K219 Gastro-esophageal reflux disease without esophagitis: Secondary | ICD-10-CM | POA: Diagnosis not present

## 2023-12-26 NOTE — Patient Instructions (Addendum)
 1. Chronic rhinitis - Testing at the last visit showed: grasses, weeds, trees, indoor molds, outdoor molds, dust mites, dog, and cockroach - Copy of test results provided.  - Continue with: alternating antihistamines as you are doing up to twice daily - Start taking: Ryaltris  (olopatadine/mometasone) two sprays per nostril 1-2 times daily as needed - Sample provided.  - You can use an extra dose of the antihistamine, if needed, for breakthrough symptoms.  - Consider nasal saline rinses 1-2 times daily to remove allergens from the nasal cavities as well as help with mucous clearance (this is especially helpful to do before the nasal sprays are given) - We will give you the codes so you can confirm the pricing.  - Allergy  shots are curative.   2. History of asthma - Lung testing looked fine the first time.   3. Return in about 1 year (around 12/25/2024). You can have the follow up appointment with Dr. Idolina Maker or a Nurse Practicioner (our Nurse Practitioners are excellent and always have Physician oversight!).    Please inform us  of any Emergency Department visits, hospitalizations, or changes in symptoms. Call us  before going to the ED for breathing or allergy  symptoms since we might be able to fit you in for a sick visit. Feel free to contact us  anytime with any questions, problems, or concerns.  It was a pleasure to meet you today!  Websites that have reliable patient information: 1. American Academy of Asthma, Allergy , and Immunology: www.aaaai.org 2. Food Allergy  Research and Education (FARE): foodallergy.org 3. Mothers of Asthmatics: http://www.asthmacommunitynetwork.org 4. [employer] of Allergy , Asthma, and Immunology: www.acaai.org      "Like" us  on Facebook and Instagram for our latest updates!      A healthy democracy works best when [employer] participate! Make sure you are registered to vote! If you have moved or changed any of your contact information, you will need  to get this updated before voting! Scan the QR codes below to learn more!     Allergy  Shots  Allergies are the result of a chain reaction that starts in the immune system. Your immune system controls how your body defends itself. For instance, if you have an allergy  to pollen, your immune system identifies pollen as an invader or allergen. Your immune system overreacts by producing antibodies called Immunoglobulin E (IgE). These antibodies travel to cells that release chemicals, causing an allergic reaction.  The concept behind allergy  immunotherapy, whether it is received in the form of shots or tablets, is that the immune system can be desensitized to specific allergens that trigger allergy  symptoms. Although it requires time and patience, the payback can be long-term relief. Allergy  injections contain a dilute solution of those substances that you are allergic to based upon your skin testing and allergy  history.   How Do Allergy  Shots Work?  Allergy  shots work much like a vaccine. Your body responds to injected amounts of a particular allergen given in increasing doses, eventually developing a resistance and tolerance to it. Allergy  shots can lead to decreased, minimal or no allergy  symptoms.  There generally are two phases: build-up and maintenance. Build-up often ranges from three to six months and involves receiving injections with increasing amounts of the allergens. The shots are typically given once or twice a week, though more rapid build-up schedules are sometimes used.  The maintenance phase begins when the most effective dose is reached. This dose is different for each person, depending on how allergic you are and your  response to the build-up injections. Once the maintenance dose is reached, there are longer periods between injections, typically two to four weeks.  Occasionally doctors give cortisone-type shots that can temporarily reduce allergy  symptoms. These types of shots are  different and should not be confused with allergy  immunotherapy shots.  Who Can Be Treated with Allergy  Shots?  Allergy  shots may be a good treatment approach for people with allergic rhinitis (hay fever), allergic asthma, conjunctivitis (eye allergy ) or stinging insect allergy .   Before deciding to begin allergy  shots, you should consider:   The length of allergy  season and the severity of your symptoms  Whether medications and/or changes to your environment can control your symptoms  Your desire to avoid long-term medication use  Time: allergy  immunotherapy requires a major time commitment  Cost: may vary depending on your insurance coverage  Allergy  shots for children age 29 and older are effective and often well tolerated. They might prevent the onset of new allergen sensitivities or the progression to asthma.  Allergy  shots are not started on patients who are pregnant but can be continued on patients who become pregnant while receiving them. In some patients with other medical conditions or who take certain common medications, allergy  shots may be of risk. It is important to mention other medications you talk to your allergist.   What are the two types of build-ups offered:   RUSH or Rapid Desensitization -- one day of injections lasting from 8:30-4:30pm, injections every 1 hour.  Approximately half of the build-up process is completed in that one day.  The following week, normal build-up is resumed, and this entails ~16 visits either weekly or twice weekly, until reaching your "maintenance dose" which is continued weekly until eventually getting spaced out to every month for a duration of 3 to 5 years. The regular build-up appointments are nurse visits where the injections are administered, followed by required monitoring for 30 minutes.    Traditional build-up -- weekly visits for 6 -12 months until reaching "maintenance dose", then continue weekly until eventually spacing out to  every 4 weeks as above. At these appointments, the injections are administered, followed by required monitoring for 30 minutes.     Either way is acceptable, and both are equally effective. With the rush protocol, the advantage is that less time is spent here for injections overall AND you would also reach maintenance dosing faster (which is when the clinical benefit starts to become more apparent). Not everyone is a candidate for rapid desensitization.   IF we proceed with the RUSH protocol, there are premedications which must be taken the day before and the day after the rush only (this includes antihistamines, steroids, and Singulair).  After the rush day, no prednisone  or Singulair is required, and we just recommend antihistamines taken on your injection day.  What Is An Estimate of the Costs?  If you are interested in starting allergy  injections, please check with your insurance company about your coverage for both allergy  vial sets and allergy  injections.  Please do so prior to making the appointment to start injections.  The following are CPT codes to give to your insurance company. These are the amounts we BILL to the insurance company, but the amount YOU WILL PAY and WE RECEIVE IS SUBSTANTIALLY LESS and depends on the contracts we have with different insurance companies.   Amount Billed to Insurance  Two allergy  vial set  CPT 95165   $ 2400      Two injections  CPT 95117   $ 40  RUSH (Rapid Desensitization) CPT 95180 x 8 hours  $500/hour  Regarding the allergy  injections, your co-pay may or may not apply with each injection, so please confirm this with your insurance company. When you start allergy  injections, 1 or 2 sets of vials are made based on your allergies.  Not all patients can be on one set of vials. A set of vials lasts 6 months to a year depending on how quickly you can proceed with your build-up of your allergy  injections. Vials are personalized for each patient depending on  their specific allergens.  How often are allergy  injection given during the build-up period?   Injections are given at least weekly during the build-up period until your maintenance dose is achieved. Per the doctor's discretion, you may have the option of getting allergy  injections two times per week during the build-up period. However, there must be at least 48 hours between injections. The build-up period is usually completed within 6-12 months depending on your ability to schedule injections and for adjustments for reactions. When maintenance dose is reached, your injection schedule is gradually changed to every two weeks and later to every three weeks. Injections will then continue every 4 weeks. Usually, injections are continued for a total of 3-5 years.   When Will I Feel Better?  Some may experience decreased allergy  symptoms during the build-up phase. For others, it may take as long as 12 months on the maintenance dose. If there is no improvement after a year of maintenance, your allergist will discuss other treatment options with you.  If you aren't responding to allergy  shots, it may be because there is not enough dose of the allergen in your vaccine or there are missing allergens that were not identified during your allergy  testing. Other reasons could be that there are high levels of the allergen in your environment or major exposure to non-allergic triggers like tobacco smoke.  What Is the Length of Treatment?  Once the maintenance dose is reached, allergy  shots are generally continued for three to five years. The decision to stop should be discussed with your allergist at that time. Some people may experience a permanent reduction of allergy  symptoms. Others may relapse and a longer course of allergy  shots can be considered.  What Are the Possible Reactions?  The two types of adverse reactions that can occur with allergy  shots are local and systemic. Common local reactions include  very mild redness and swelling at the injection site, which can happen immediately or several hours after. Report a delayed reaction from your last injection. These include arm swelling or runny nose, watery eyes or cough that occurs within 12-24 hours after injection. A systemic reaction, which is less common, affects the entire body or a particular body system. They are usually mild and typically respond quickly to medications. Signs include increased allergy  symptoms such as sneezing, a stuffy nose or hives.   Rarely, a serious systemic reaction called anaphylaxis can develop. Symptoms include swelling in the throat, wheezing, a feeling of tightness in the chest, nausea or dizziness. Most serious systemic reactions develop within 30 minutes of allergy  shots. This is why it is strongly recommended you wait in your doctor's office for 30 minutes after your injections. Your allergist is trained to watch for reactions, and his or her staff is trained and equipped with the proper medications to identify and treat them.   Report to the nurse immediately if you experience any of  the following symptoms: swelling, itching or redness of the skin, hives, watery eyes/nose, breathing difficulty, excessive sneezing, coughing, stomach pain, diarrhea, or light headedness. These symptoms may occur within 15-20 minutes after injection and may require medication.   Who Should Administer Allergy  Shots?  The preferred location for receiving shots is your prescribing allergist's office. Injections can sometimes be given at another facility where the physician and staff are trained to recognize and treat reactions, and have received instructions by your prescribing allergist.  What if I am late for an injection?   Injection dose will be adjusted depending upon how many days or weeks you are late for your injection.   What if I am sick?   Please report any illness to the nurse before receiving injections. She may adjust  your dose or postpone injections depending on your symptoms. If you have fever, flu, sinus infection or chest congestion it is best to postpone allergy  injections until you are better. Never get an allergy  injection if your asthma is causing you problems. If your symptoms persist, seek out medical care to get your health problem under control.  What If I am or Become Pregnant:  Women that become pregnant should schedule an appointment with [facility] before receiving any further allergy  injections.

## 2023-12-26 NOTE — Progress Notes (Signed)
 FOLLOW UP  Date of Service/Encounter:  12/26/23   Assessment:   History of asthma - with normal spiro at the last visit    Seasonal and perennial allergic rhinitis (grasses, weeds, trees, indoor molds, outdoor molds, dust mites, dog, and cockroach)   Gastroesophageal reflux disease   Singulair non-responder  Plan/Recommendations:   1. Chronic rhinitis - Testing at the last visit showed: grasses, weeds, trees, indoor molds, outdoor molds, dust mites, dog, and cockroach - Copy of test results provided.  - Continue with: alternating antihistamines as you are doing up to twice daily - Start taking: Ryaltris  (olopatadine/mometasone) two sprays per nostril 1-2 times daily as needed - Sample provided.  - You can use an extra dose of the antihistamine, if needed, for breakthrough symptoms.  - Consider nasal saline rinses 1-2 times daily to remove allergens from the nasal cavities as well as help with mucous clearance (this is especially helpful to do before the nasal sprays are given) - We will give you the codes so you can confirm the pricing.  - Allergy  shots are curative.   2. History of asthma - Lung testing looked fine the first time.   3. Return in about 1 year (around 12/25/2024). You can have the follow up appointment with Dr. Idolina Maker or a Nurse Practicioner (our Nurse Practitioners are excellent and always have Physician oversight!).    Subjective:   Brady Conley is a 38 y.o. male presenting today for follow up of  Chief Complaint  Patient presents with   Follow-up    Brady Conley has a history of the following: Patient Active Problem List   Diagnosis Date Noted   Chronic rhinitis 06/18/2023   Other specified disorders of eustachian tube, bilateral 06/18/2023   Chronic cough 09/08/2021   ADD (attention deficit disorder) 06/26/2019   Low testosterone in male 06/26/2019    History obtained from: chart review and patient.  Discussed the use of AI scribe  software for clinical note transcription with the patient and/or guardian, who gave verbal consent to proceed.  Brady Conley is a 38 y.o. male presenting for a follow up visit.  He was last seen in March 2025.  At that time, he had testing that was positive to multiple indoor outdoor allergens including grasses, weeds, trees, molds, dust mite, dog, and cockroach.  We stopped the Flonase and continue with alternating antihistamines.  We also started on Eliquis.  Since last visit, he has done relatively well.  He manages his allergies with a nasal spray, which he finds effective. He experiences some allergy  symptoms but has not needed additional medication beyond his nightly dose. He alternates his medication regimen, currently on a Thursday schedule, and has not required allergy  shots recently due to good symptom control.  He has not experienced any sinus infections recently. He discusses the financial aspects of allergy  treatment, noting that his insurance may not cover certain procedures, and he has a $500 deductible.  He lives next to his mother-in-law, which he describes as mostly positive. 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 He mentions a previous instance where he ran out of medication and missed a day, but this has been resolved.     Otherwise, there have been no changes to his past medical history, surgical history, family history, or social history.    Review of systems otherwise negative other than that mentioned in the HPI.    Objective:   Blood pressure  130/84, pulse 76, temperature 98.6 F (37 C), resp. rate 16, weight 226 lb 4 oz (102.6 kg), SpO2 97%. Body mass index is 35.44 kg/m.    Physical Exam Vitals reviewed.  Constitutional:      Appearance: He is well-developed.  HENT:     Head: Normocephalic and atraumatic.     Right Ear: Tympanic membrane, ear canal and external ear normal. No drainage, swelling or tenderness.  Tympanic membrane is not injected, scarred, erythematous, retracted or bulging.     Left Ear: Tympanic membrane, ear canal and external ear normal. No drainage, swelling or tenderness. Tympanic membrane is not injected, scarred, erythematous, retracted or bulging.     Nose: No nasal deformity, septal deviation, mucosal edema or rhinorrhea.     Right Turbinates: Enlarged, swollen and pale.     Left Turbinates: Enlarged, swollen and pale.     Right Sinus: No maxillary sinus tenderness or frontal sinus tenderness.     Left Sinus: No maxillary sinus tenderness or frontal sinus tenderness.     Comments: No polyps noted.    Mouth/Throat:     Lips: Pink.     Mouth: Mucous membranes are moist. Mucous membranes are not pale and not dry.     Pharynx: Uvula midline.  Eyes:     General: Lids are normal. Allergic shiner present.        Right eye: No discharge.        Left eye: No discharge.     Conjunctiva/sclera: Conjunctivae normal.     Right eye: Right conjunctiva is not injected. No chemosis.    Left eye: Left conjunctiva is not injected. No chemosis.    Pupils: Pupils are equal, round, and reactive to light.  Cardiovascular:     Rate and Rhythm: Normal rate and regular rhythm.     Heart sounds: Normal heart sounds.  Pulmonary:     Effort: Pulmonary effort is normal. No tachypnea, accessory muscle usage or respiratory distress.     Breath sounds: Normal breath sounds. No wheezing, rhonchi or rales.     Comments: Moving air well in all lung fields. No increased work of breathing noted.  Chest:     Chest wall: No tenderness.  Lymphadenopathy:     Head:     Right side of head: No submandibular, tonsillar or occipital adenopathy.     Left side of head: No submandibular, tonsillar or occipital adenopathy.     Cervical: No cervical adenopathy.  Skin:    Coloration: Skin is not pale.     Findings: No abrasion, erythema, petechiae or rash. Rash is not papular, urticarial or vesicular.   Neurological:     Mental Status: He is alert.  Psychiatric:        Behavior: Behavior is cooperative.      Diagnostic studies: none        Drexel Gentles, MD  [facility] 

## 2024-05-08 ENCOUNTER — Other Ambulatory Visit: Payer: Self-pay | Admitting: Allergy & Immunology

## 2024-05-08 DIAGNOSIS — J302 Other seasonal allergic rhinitis: Secondary | ICD-10-CM

## 2024-06-24 ENCOUNTER — Encounter: Payer: Self-pay | Admitting: Internal Medicine

## 2024-06-24 ENCOUNTER — Ambulatory Visit: Admitting: Internal Medicine

## 2024-06-24 ENCOUNTER — Other Ambulatory Visit: Payer: Self-pay

## 2024-06-24 VITALS — BP 128/76 | HR 100 | Temp 98.5°F | Ht 67.0 in | Wt 215.2 lb

## 2024-06-24 DIAGNOSIS — R053 Chronic cough: Secondary | ICD-10-CM

## 2024-06-24 DIAGNOSIS — J302 Other seasonal allergic rhinitis: Secondary | ICD-10-CM

## 2024-06-24 DIAGNOSIS — J3089 Other allergic rhinitis: Secondary | ICD-10-CM

## 2024-06-24 MED ORDER — QVAR REDIHALER 80 MCG/ACT IN AERB: 2.0000 | INHALATION_SPRAY | Freq: Two times a day (BID) | RESPIRATORY_TRACT | 5 refills | Status: DC

## 2024-06-24 NOTE — Progress Notes (Signed)
   FOLLOW UP Date of Service/Encounter:  06/24/24   Subjective:  Brady Conley (DOB: [birth year]) is a 38 y.o. male who [facility] for follow up for an acute visit.   History obtained from: chart review and patient. Last seen on 12/26/2023 with Dr gallagher for hx of asthma, chronic rhinitis.  Discussed use of Ryaltris  and anti histamine, consider AIT.  Reports having trouble with chronic cough, generally in Spring and Fall.  Started around October this time and seems to come and go.  Has seen multiple specialist- pulm, ent, allergist.  Tried Albuterol with worsening.  Notes having post nasal drainage but not too much recently as Ryaltris  and Zyrtec are helping.  Tried sinus irrigations without much help.  Worse during daytime, improved when he is sleeping.  Atrovent nasal spray did not help.  Has undergone sinus surgery without resolution.  Recently given course of oral prednisone  without any help.    Past Medical History: Past Medical History:  Diagnosis Date   ADD (attention deficit disorder) 06/26/2019   Anxiety    Asthma    Low testosterone in male 06/26/2019   Recurrent upper respiratory infection (URI)    Sleep apnea     Objective:  BP 128/76 (BP Location: Left Arm, Patient Position: Sitting, Cuff Size: Large)   Pulse 100   Temp 98.5 F (36.9 C) (Temporal)   Ht 5' 7 (1.702 m)   Wt 215 lb 3.2 oz (97.6 kg)   SpO2 97%   BMI 33.71 kg/m  Body mass index is 33.71 kg/m. Physical Exam: GEN: alert, well developed HEENT: clear conjunctiva, nose with mild inferior turbinate hypertrophy, pink nasal mucosa, no rhinorrhea, + cobblestoning HEART: regular rate and rhythm, no murmur LUNGS: clear to auscultation bilaterally, + dry coughing, unlabored respiration SKIN: no rashes or lesions  Assessment:   1. Seasonal and perennial allergic rhinitis   2. Chronic cough     Plan/Recommendations:   Allergic Rhinitis: - Controlled  -  Positive skin test 10/2023: grasses, weeds, trees, indoor molds, outdoor molds, dust mites, dog, and cockroach  - Use nasal saline rinses before nose sprays such as with Neilmed Sinus Rinse.  Use distilled water.   - Use Ryaltris  2 sprays each nostril twice daily. Aim upward and outward. - Use Zyrtec 10 mg daily.  - Consider allergy  shots as long term control of your symptoms by teaching your immune system to be more tolerant of your allergy  triggers.    Chronic Cough:  - Do think there is a component of habit cough; keep cough variant asthma in ddx, will do trial of ICS inhaler.  Can also consider AIT to help with post nasal drainage.   - Use peppermint altoids. - Avoid throat clearing.  Instead take a sip of water.   - Start Qvar 80mcg 2 puffs twice daily.  Brush and gargle after use.  MDI technique discussed.    Keep follow up in May.   Arleta Blanch, MD [facility]

## 2024-06-24 NOTE — Patient Instructions (Addendum)
 Allergic Rhinitis: - Positive skin test 10/2023: grasses, weeds, trees, indoor molds, outdoor molds, dust mites, dog, and cockroach  - Use nasal saline rinses before nose sprays such as with Neilmed Sinus Rinse.  Use distilled water.   - Use Ryaltris  2 sprays each nostril twice daily. Aim upward and outward. - Use Zyrtec 10 mg daily.  - Consider allergy  shots as long term control of your symptoms by teaching your immune system to be more tolerant of your allergy  triggers.    Chronic Cough:  - Use peppermint altoids. - Avoid throat clearing.  Instead take a sip of water.   - Start Qvar 80mcg 2 puffs twice daily.  Brush and gargle after use.

## 2024-07-23 ENCOUNTER — Encounter: Payer: Self-pay | Admitting: Allergy & Immunology

## 2024-07-23 ENCOUNTER — Other Ambulatory Visit: Payer: Self-pay

## 2024-07-23 ENCOUNTER — Ambulatory Visit: Admitting: Allergy & Immunology

## 2024-07-23 VITALS — BP 138/80 | HR 96 | Temp 99.1°F | Wt 216.8 lb

## 2024-07-23 DIAGNOSIS — R053 Chronic cough: Secondary | ICD-10-CM | POA: Diagnosis not present

## 2024-07-23 DIAGNOSIS — J329 Chronic sinusitis, unspecified: Secondary | ICD-10-CM | POA: Diagnosis not present

## 2024-07-23 DIAGNOSIS — K219 Gastro-esophageal reflux disease without esophagitis: Secondary | ICD-10-CM

## 2024-07-23 DIAGNOSIS — J3089 Other allergic rhinitis: Secondary | ICD-10-CM

## 2024-07-23 DIAGNOSIS — J4520 Mild intermittent asthma, uncomplicated: Secondary | ICD-10-CM

## 2024-07-23 DIAGNOSIS — J302 Other seasonal allergic rhinitis: Secondary | ICD-10-CM

## 2024-07-23 MED ORDER — FAMOTIDINE 40 MG PO TABS: 40.0000 mg | ORAL_TABLET | Freq: Every day | ORAL | 1 refills | Status: AC

## 2024-07-23 MED ORDER — SUCRALFATE 1 G PO TABS: 1.0000 g | ORAL_TABLET | Freq: Three times a day (TID) | ORAL | 0 refills | Status: AC

## 2024-07-23 MED ORDER — OMEPRAZOLE 20 MG PO CPDR: 20.0000 mg | DELAYED_RELEASE_CAPSULE | Freq: Every day | ORAL | 1 refills | Status: AC

## 2024-07-23 NOTE — Progress Notes (Signed)
 FOLLOW UP  Date of Service/Encounter:  07/23/24   Assessment:   History of asthma - with normal spiro today   Chronic cough - worsened in the last several months with persistent throat clearing (likely multifactorial)  Consider EoE if this cough continues, especially with the food allergy  history  Multiple food allergies (milk, seafood, wheat, peanuts, tree nuts, oat, corn) - interested in starting Xolair   Seasonal and perennial allergic rhinitis (grasses, weeds, trees, indoor molds, outdoor molds, dust mites, dog, and cockroach) - interested in starting allergen immunotherapy    Gastroesophageal reflux disease - adding famotidine and carafate today to current PPI   Singulair non-responder  Plan/Recommendations:   1. Chronic rhinitis - Testing at the last visit showed: grasses, weeds, trees, indoor molds, outdoor molds, dust mites, dog, and cockroach - We are getting a sinus CT.  - Continue with: alternating antihistamines as you are doing up to twice daily - Start taking: Ryaltris  (olopatadine/mometasone) two sprays per nostril 1-2 times daily as needed - Sample provided.  - You can use an extra dose of the antihistamine, if needed, for breakthrough symptoms.  - Consider nasal saline rinses 1-2 times daily to remove allergens from the nasal cavities as well as help with mucous clearance (this is especially helpful to do before the nasal sprays are given) - We will give you the codes so you can confirm the pricing.  - Allergy  shots are curative.   2. History of asthma - Lung testing looked great today.  - We are going to get a chest X-ray to see if there is a pneumonia or other abnormality.   3. GERD - We are going to maximize GERD management. - Continue omeprazole daily. - Add on Pepcid 40mg  daily.  - Add on Carafate 1 gm every 8 hours for two weeks.  4. Multiple food allergies - EpiPen  is up to date. - Consent for Xolair signed today. - We are getting an updated  IgE so we can dose this.   4. Return in about 4 weeks (around 08/20/2024). You can have the follow up appointment with Dr. Iva or a Nurse Practicioner (our Nurse Practitioners are excellent and always have Physician oversight!).   Subjective:   Brady Conley is a 38 y.o. male presenting today for follow up of  Chief Complaint  Patient presents with   Seasonal and perennial allergic rhinitis   Asthma   Follow-up   Cough    Brady Conley has a history of the following: Patient Active Problem List   Diagnosis Date Noted   Chronic rhinitis 06/18/2023   Other specified disorders of eustachian tube, bilateral 06/18/2023   Chronic cough 09/08/2021   ADD (attention deficit disorder) 06/26/2019   Low testosterone in male 06/26/2019    History obtained from: chart review and patient.  Discussed the use of AI scribe software for clinical note transcription with the patient and/or guardian, who gave verbal consent to proceed.  Brady Conley is a 38 y.o. male presenting for a sick visit.  He was last seen in November 2025.  At that time, he presented with a chronic cough.  Dr. Marcellus felt that some of this was related to a chronic cough.  He was started on Qvar  80 mcg 2 puffs twice daily see if this helped at all.  He was also encouraged to use peppermint Altoid aids and allergy  shots in order to decrease postnasal drip.  No imaging was performed.  Since the last visit, he has continued  to have issues with this chronic cough.   He has experienced a persistent cough since September 2025. Initially, he did not feel ill but developed a cough that persisted. Around May 12, 2024, he felt unwell for two days with congestion but no fever. He consulted with [facility] and was prescribed prednisone  (20 mg for five days) which did not alleviate his symptoms. Subsequently, he was prescribed a Z-Pak and a tapered dose of prednisone  by another provider, which provided temporary relief for two days  before the cough returned.  He consulted with another doctor in [place] who prescribed an inhaler, which did not improve his symptoms. He has a remote history of asthma from childhood. He also tried using Singulair from 2023, which was not effective. No chest pain or productive cough, even after using cough syrup containing guaifenesin. He experiences pressure and congestion but can breathe without difficulty. He uses a CPAP machine at night and takes omeprazole for heartburn.  He describes episodes of severe coughing that lead to hemoptysis due to throat irritation. These episodes are accompanied by a choking sensation and increased heart rate, reaching 160-170 bpm. His cough is less severe on weekends and worsens at work, where he suspects environmental factors such as dust and mold may contribute. He takes antihistamines and Sudafed regularly to manage his symptoms.  He has undergone a septoplasty and turbinate reduction in the past, which improved his breathing but did not resolve his cough. He has not had any recent imaging studies such as a chest x-ray or sinus CT. He continues to engage in physical activities like mild cardio and mountain biking without significant respiratory issues during exercise. He is not sure that the exercise HELPS his symptoms, but it does not necessarily make it WORSE.     GERD Symptom History: He started omeprazole and this did not work at all. He has the inclined pillow and he wears CPAP when he sleeps. He has never been on more than one medication for his GERD.   Food Allergy  Symptom History: He has a history of food allergies, including peanuts and dairy, and generally tries to avoid certain foods, though he sometimes eats small amounts of them. He had labs done from [facility] September 2021 that showed low IgE levels to cod, milk, and shrimp with higher levels to wheat, peanut, almond, crab, rye, barley, oat, and corn. His symptoms seem to be relatively dose dependent  and he starts to have the dysphagia episodes and stomach pain and itching of the throat. He has not had a full blown anaphylaxis episode in the last few years.     His wife brings up the idea of starting Xolair, so we spent some time discussing this. She has checked and it is covered by their insurance.   Otherwise, there have been no changes to his past medical history, surgical history, family history, or social history.    Review of systems otherwise negative other than that mentioned in the HPI.    Objective:   Blood pressure 138/80, pulse 96, temperature 99.1 F (37.3 C), temperature source Temporal, weight 216 lb 12.8 oz (98.3 kg), SpO2 98%. Body mass index is 33.96 kg/m.    Physical Exam Vitals reviewed.  Constitutional:      Appearance: He is well-developed.  HENT:     Head: Normocephalic and atraumatic.     Right Ear: Tympanic membrane, ear canal and external ear normal. No drainage, swelling or tenderness. Tympanic membrane is not injected, scarred, erythematous, retracted or  bulging.     Left Ear: Tympanic membrane, ear canal and external ear normal. No drainage, swelling or tenderness. Tympanic membrane is not injected, scarred, erythematous, retracted or bulging.     Nose: No nasal deformity, septal deviation, mucosal edema or rhinorrhea.     Right Turbinates: Enlarged, swollen and pale.     Left Turbinates: Enlarged, swollen and pale.     Right Sinus: No maxillary sinus tenderness or frontal sinus tenderness.     Left Sinus: No maxillary sinus tenderness or frontal sinus tenderness.     Comments: No polyps noted.    Mouth/Throat:     Lips: Pink.     Mouth: Mucous membranes are moist. Mucous membranes are not pale and not dry.     Pharynx: Uvula midline.  Eyes:     General: Lids are normal. Allergic shiner present.        Right eye: No discharge.        Left eye: No discharge.     Conjunctiva/sclera: Conjunctivae normal.     Right eye: Right conjunctiva is  not injected. No chemosis.    Left eye: Left conjunctiva is not injected. No chemosis.    Pupils: Pupils are equal, round, and reactive to light.  Cardiovascular:     Rate and Rhythm: Normal rate and regular rhythm.     Heart sounds: Normal heart sounds.  Pulmonary:     Effort: Pulmonary effort is normal. No tachypnea, accessory muscle usage or respiratory distress.     Breath sounds: Normal breath sounds. No wheezing, rhonchi or rales.     Comments: Moving air well in all lung fields. No increased work of breathing noted. Some coughing during the visit, especially with deeper inhalations.   Chest:     Chest wall: No tenderness.  Lymphadenopathy:     Head:     Right side of head: No submandibular, tonsillar or occipital adenopathy.     Left side of head: No submandibular, tonsillar or occipital adenopathy.     Cervical: No cervical adenopathy.  Skin:    Coloration: Skin is not pale.     Findings: No abrasion, erythema, petechiae or rash. Rash is not papular, urticarial or vesicular.  Neurological:     Mental Status: He is alert.  Psychiatric:        Behavior: Behavior is cooperative.      Diagnostic studies:    Spirometry: results normal (FEV1: 3.88/99%, FVC: 4.63/96%, FEV1/FVC: 84%).    Spirometry consistent with normal pattern.   Allergy  Studies: labs sent instead       Marty Shaggy, MD  [facility] 

## 2024-07-23 NOTE — Patient Instructions (Addendum)
 1. Chronic rhinitis - Testing at the last visit showed: grasses, weeds, trees, indoor molds, outdoor molds, dust mites, dog, and cockroach - We are getting a sinus CT.  - Continue with: alternating antihistamines as you are doing up to twice daily - Start taking: Ryaltris  (olopatadine/mometasone) two sprays per nostril 1-2 times daily as needed - Sample provided.  - You can use an extra dose of the antihistamine, if needed, for breakthrough symptoms.  - Consider nasal saline rinses 1-2 times daily to remove allergens from the nasal cavities as well as help with mucous clearance (this is especially helpful to do before the nasal sprays are given) - We will give you the codes so you can confirm the pricing.  - Allergy  shots are curative.   2. History of asthma - Lung testing looked great today.  - We are going to get a chest X-ray to see if there is a pneumonia or other abnormality.   3. GERD - We are going to maximize GERD management. - Continue omeprazole daily. - Add on Pepcid 40mg  daily.  - Add on Carafate 1 gm every 8 hours for two weeks.  4. Multiple food allergies - EpiPen  is up to date. - Consent for Xolair signed today. - We are getting an updated IgE so we can dose this.   4. Return in about 4 weeks (around 08/20/2024). You can have the follow up appointment with Dr. Iva or a Nurse Practicioner (our Nurse Practitioners are excellent and always have Physician oversight!).    Please inform us  of any Emergency Department visits, hospitalizations, or changes in symptoms. Call us  before going to the ED for breathing or allergy  symptoms since we might be able to fit you in for a sick visit. Feel free to contact us  anytime with any questions, problems, or concerns.  It was a pleasure to meet you today!  Websites that have reliable patient information: 1. American Academy of Asthma, Allergy , and Immunology: www.aaaai.org 2. Food Allergy  Research and Education (FARE):  foodallergy.org 3. Mothers of Asthmatics: http://www.asthmacommunitynetwork.org 4. [employer] of Allergy , Asthma, and Immunology: www.acaai.org      "Like" us  on Facebook and Instagram for our latest updates!      A healthy democracy works best when [employer] participate! Make sure you are registered to vote! If you have moved or changed any of your contact information, you will need to get this updated before voting! Scan the QR codes below to learn more!     Allergy  Shots  Allergies are the result of a chain reaction that starts in the immune system. Your immune system controls how your body defends itself. For instance, if you have an allergy  to pollen, your immune system identifies pollen as an invader or allergen. Your immune system overreacts by producing antibodies called Immunoglobulin E (IgE). These antibodies travel to cells that release chemicals, causing an allergic reaction.  The concept behind allergy  immunotherapy, whether it is received in the form of shots or tablets, is that the immune system can be desensitized to specific allergens that trigger allergy  symptoms. Although it requires time and patience, the payback can be long-term relief. Allergy  injections contain a dilute solution of those substances that you are allergic to based upon your skin testing and allergy  history.   How Do Allergy  Shots Work?  Allergy  shots work much like a vaccine. Your body responds to injected amounts of a particular allergen given in increasing doses, eventually developing a resistance and tolerance to it.  Allergy  shots can lead to decreased, minimal or no allergy  symptoms.  There generally are two phases: build-up and maintenance. Build-up often ranges from three to six months and involves receiving injections with increasing amounts of the allergens. The shots are typically given once or twice a week, though more rapid build-up schedules are sometimes used.  The maintenance  phase begins when the most effective dose is reached. This dose is different for each person, depending on how allergic you are and your response to the build-up injections. Once the maintenance dose is reached, there are longer periods between injections, typically two to four weeks.  Occasionally doctors give cortisone-type shots that can temporarily reduce allergy  symptoms. These types of shots are different and should not be confused with allergy  immunotherapy shots.  Who Can Be Treated with Allergy  Shots?  Allergy  shots may be a good treatment approach for people with allergic rhinitis (hay fever), allergic asthma, conjunctivitis (eye allergy ) or stinging insect allergy .   Before deciding to begin allergy  shots, you should consider:   The length of allergy  season and the severity of your symptoms  Whether medications and/or changes to your environment can control your symptoms  Your desire to avoid long-term medication use  Time: allergy  immunotherapy requires a major time commitment  Cost: may vary depending on your insurance coverage  Allergy  shots for children age 45 and older are effective and often well tolerated. They might prevent the onset of new allergen sensitivities or the progression to asthma.  Allergy  shots are not started on patients who are pregnant but can be continued on patients who become pregnant while receiving them. In some patients with other medical conditions or who take certain common medications, allergy  shots may be of risk. It is important to mention other medications you talk to your allergist.   What are the two types of build-ups offered:   RUSH or Rapid Desensitization -- one day of injections lasting from 8:30-4:30pm, injections every 1 hour.  Approximately half of the build-up process is completed in that one day.  The following week, normal build-up is resumed, and this entails ~16 visits either weekly or twice weekly, until reaching your  "maintenance dose" which is continued weekly until eventually getting spaced out to every month for a duration of 3 to 5 years. The regular build-up appointments are nurse visits where the injections are administered, followed by required monitoring for 30 minutes.    Traditional build-up -- weekly visits for 6 -12 months until reaching "maintenance dose", then continue weekly until eventually spacing out to every 4 weeks as above. At these appointments, the injections are administered, followed by required monitoring for 30 minutes.     Either way is acceptable, and both are equally effective. With the rush protocol, the advantage is that less time is spent here for injections overall AND you would also reach maintenance dosing faster (which is when the clinical benefit starts to become more apparent). Not everyone is a candidate for rapid desensitization.   IF we proceed with the RUSH protocol, there are premedications which must be taken the day before and the day after the rush only (this includes antihistamines, steroids, and Singulair).  After the rush day, no prednisone  or Singulair is required, and we just recommend antihistamines taken on your injection day.  What Is An Estimate of the Costs?  If you are interested in starting allergy  injections, please check with your insurance company about your coverage for both allergy  vial sets and allergy   injections.  Please do so prior to making the appointment to start injections.  The following are CPT codes to give to your insurance company. These are the amounts we BILL to the insurance company, but the amount YOU WILL PAY and WE RECEIVE IS SUBSTANTIALLY LESS and depends on the contracts we have with different insurance companies.   Amount Billed to Insurance  Two allergy  vial set  CPT 95165   $ 2400      Two injections   CPT 95117   $ 40  RUSH (Rapid Desensitization) CPT 95180 x 8 hours  $500/hour  Regarding the allergy  injections, your  co-pay may or may not apply with each injection, so please confirm this with your insurance company. When you start allergy  injections, 1 or 2 sets of vials are made based on your allergies.  Not all patients can be on one set of vials. A set of vials lasts 6 months to a year depending on how quickly you can proceed with your build-up of your allergy  injections. Vials are personalized for each patient depending on their specific allergens.  How often are allergy  injection given during the build-up period?   Injections are given at least weekly during the build-up period until your maintenance dose is achieved. Per the doctor's discretion, you may have the option of getting allergy  injections two times per week during the build-up period. However, there must be at least 48 hours between injections. The build-up period is usually completed within 6-12 months depending on your ability to schedule injections and for adjustments for reactions. When maintenance dose is reached, your injection schedule is gradually changed to every two weeks and later to every three weeks. Injections will then continue every 4 weeks. Usually, injections are continued for a total of 3-5 years.   When Will I Feel Better?  Some may experience decreased allergy  symptoms during the build-up phase. For others, it may take as long as 12 months on the maintenance dose. If there is no improvement after a year of maintenance, your allergist will discuss other treatment options with you.  If you aren't responding to allergy  shots, it may be because there is not enough dose of the allergen in your vaccine or there are missing allergens that were not identified during your allergy  testing. Other reasons could be that there are high levels of the allergen in your environment or major exposure to non-allergic triggers like tobacco smoke.  What Is the Length of Treatment?  Once the maintenance dose is reached, allergy  shots are generally  continued for three to five years. The decision to stop should be discussed with your allergist at that time. Some people may experience a permanent reduction of allergy  symptoms. Others may relapse and a longer course of allergy  shots can be considered.  What Are the Possible Reactions?  The two types of adverse reactions that can occur with allergy  shots are local and systemic. Common local reactions include very mild redness and swelling at the injection site, which can happen immediately or several hours after. Report a delayed reaction from your last injection. These include arm swelling or runny nose, watery eyes or cough that occurs within 12-24 hours after injection. A systemic reaction, which is less common, affects the entire body or a particular body system. They are usually mild and typically respond quickly to medications. Signs include increased allergy  symptoms such as sneezing, a stuffy nose or hives.   Rarely, a serious systemic reaction called anaphylaxis can develop.  Symptoms include swelling in the throat, wheezing, a feeling of tightness in the chest, nausea or dizziness. Most serious systemic reactions develop within 30 minutes of allergy  shots. This is why it is strongly recommended you wait in your doctor's office for 30 minutes after your injections. Your allergist is trained to watch for reactions, and his or her staff is trained and equipped with the proper medications to identify and treat them.   Report to the nurse immediately if you experience any of the following symptoms: swelling, itching or redness of the skin, hives, watery eyes/nose, breathing difficulty, excessive sneezing, coughing, stomach pain, diarrhea, or light headedness. These symptoms may occur within 15-20 minutes after injection and may require medication.   Who Should Administer Allergy  Shots?  The preferred location for receiving shots is your prescribing allergist's office. Injections can sometimes be  given at another facility where the physician and staff are trained to recognize and treat reactions, and have received instructions by your prescribing allergist.  What if I am late for an injection?   Injection dose will be adjusted depending upon how many days or weeks you are late for your injection.   What if I am sick?   Please report any illness to the nurse before receiving injections. She may adjust your dose or postpone injections depending on your symptoms. If you have fever, flu, sinus infection or chest congestion it is best to postpone allergy  injections until you are better. Never get an allergy  injection if your asthma is causing you problems. If your symptoms persist, seek out medical care to get your health problem under control.  What If I am or Become Pregnant:  Women that become pregnant should schedule an appointment with [facility] before receiving any further allergy  injections.

## 2024-07-24 DIAGNOSIS — J3089 Other allergic rhinitis: Secondary | ICD-10-CM | POA: Diagnosis not present

## 2024-07-24 DIAGNOSIS — J301 Allergic rhinitis due to pollen: Secondary | ICD-10-CM | POA: Diagnosis not present

## 2024-07-24 DIAGNOSIS — J3081 Allergic rhinitis due to animal (cat) (dog) hair and dander: Secondary | ICD-10-CM | POA: Diagnosis not present

## 2024-07-24 DIAGNOSIS — J302 Other seasonal allergic rhinitis: Secondary | ICD-10-CM | POA: Diagnosis not present

## 2024-07-24 NOTE — Addendum Note (Signed)
 Addended by: IVA MARTY SALTNESS on: 07/24/2024 12:18 PM   Modules accepted: Orders

## 2024-07-24 NOTE — Progress Notes (Deleted)
 VIALS WILL BE MADE CLOSER TO APPT.

## 2024-07-24 NOTE — Progress Notes (Signed)
 Aeroallergen Immunotherapy  Ordering Provider: Marty Shaggy, MD  Patient Details Name: Brady Conley MRN: [id number] Date of Birth: October 28, 1985  Order 1 of 2  Vial Label: G/W/T/C/D  0.3 ml (Volume)  BAU Concentration -- 7 Grass Mix* 100,000 ([place], [place], [place], Perennial [place], RedTop, Sweet Vernal, Timothy) 0.2 ml (Volume)  1:20 Concentration -- [place] 0.2 ml (Volume)  1:20 Concentration -- Johnson 0.5 ml (Volume)  1:20 Concentration -- Weed Mix* 0.2 ml (Volume)  1:20 Concentration -- Box Elder 0.2 ml (Volume)  1:10 Concentration -- Pecan Pollen 0.2 ml (Volume)  1:20 Concentration -- Walnut, Black Pollen 0.2 ml (Volume)  1:20 Concentration -- [place] American* 0.2 ml (Volume)  1:20 Concentration -- Cottonwood, Eastern* 0.3 ml (Volume)  1:10 Concentration -- Oak, Eastern mix* 0.7 ml (Volume)  1:10 Concentration -- Cat Hair 0.7 ml (Volume)  1:10 Concentration -- Dog Epithelia   3.9  ml Extract Subtotal 1.1  ml Diluent 5.0  ml Maintenance Total  Schedule:  A  Silver Vial (1:10,000): RUSH Green Vial (1:1,000): RUSH Blue Vial (1:100): RUSH Yellow Vial (1:10): Schedule A (10 doses) Red Vial (1:1): Schedule A (14 doses)  Special Instructions: After completion of the first Red Vial, please space to every two weeks. After completion of the second Red Vial, please space to every 4 weeks. Ok to up dose new vials on Schedule D. Ok to come twice weekly, if desired, as long as there is 48 hours between injections.      Willim Rush, [place], [place], elder, pec, [place], [place], [place], [place], [place], D

## 2024-07-24 NOTE — Progress Notes (Signed)
 VIALS MADE ON 07/25/24

## 2024-07-24 NOTE — Progress Notes (Signed)
 Aeroallergen Immunotherapy  Ordering Provider: Marty Shaggy, MD  Patient Details Name: Brady Conley MRN: [id number] Date of Birth: 19-Apr-1986  Order 2 of 2  Vial Label: Molds/CR/DM  0.2 ml (Volume)  1:10 Concentration -- Aspergillus mix 0.2 ml (Volume)  1:10 Concentration -- Penicillium mix 0.2 ml (Volume)  1:10 Concentration -- Mucor plumbeus 0.2 ml (Volume)  1:10 Concentration -- Fusarium moniliforme 0.2 ml (Volume)  1:40 Concentration -- Aureobasidium pullulans 1.0 ml (Volume)   AU Concentration -- Mite Mix (DF 5,000 & DP 5,000) 0.3 ml (Volume)  1:20 Concentration -- Cockroach, German   2.3  ml Extract Subtotal 2.7  ml Diluent 5.0  ml Maintenance Total  Schedule:  A  Silver Vial (1:10,000): RUSH Green Vial (1:1,000): RUSH Blue Vial (1:100): RUSH Yellow Vial (1:10): Schedule A (10 doses) Red Vial (1:1): Schedule A (14 doses)  Special Instructions: After completion of the first Red Vial, please space to every two weeks. After completion of the second Red Vial, please space to every 4 weeks. Ok to up dose new vials on Schedule D. Ok to come twice weekly, if desired, as long as there is 48 hours between injections.

## 2024-07-25 DIAGNOSIS — J3081 Allergic rhinitis due to animal (cat) (dog) hair and dander: Secondary | ICD-10-CM | POA: Diagnosis not present

## 2024-07-25 DIAGNOSIS — J302 Other seasonal allergic rhinitis: Secondary | ICD-10-CM | POA: Diagnosis not present

## 2024-07-25 DIAGNOSIS — J301 Allergic rhinitis due to pollen: Secondary | ICD-10-CM | POA: Diagnosis not present

## 2024-07-25 DIAGNOSIS — J3089 Other allergic rhinitis: Secondary | ICD-10-CM | POA: Diagnosis not present

## 2024-07-28 LAB — CBC WITH DIFFERENTIAL/PLATELET
Basophils Absolute: 0.1 x10E3/uL (ref 0.0–0.2)
Basos: 1 %
EOS (ABSOLUTE): 0.1 x10E3/uL (ref 0.0–0.4)
Eos: 1 %
Hematocrit: 52.6 % — ABNORMAL HIGH (ref 37.5–51.0)
Hemoglobin: 17.7 g/dL (ref 13.0–17.7)
Immature Grans (Abs): 0.2 x10E3/uL — ABNORMAL HIGH (ref 0.0–0.1)
Immature Granulocytes: 2 %
Lymphocytes Absolute: 2.5 x10E3/uL (ref 0.7–3.1)
Lymphs: 30 %
MCH: 30.6 pg (ref 26.6–33.0)
MCHC: 33.7 g/dL (ref 31.5–35.7)
MCV: 91 fL (ref 79–97)
Monocytes Absolute: 0.7 x10E3/uL (ref 0.1–0.9)
Monocytes: 8 %
Neutrophils Absolute: 5 x10E3/uL (ref 1.4–7.0)
Neutrophils: 58 %
Platelets: 221 x10E3/uL (ref 150–450)
RBC: 5.79 x10E6/uL (ref 4.14–5.80)
RDW: 13 % (ref 11.6–15.4)
WBC: 8.5 x10E3/uL (ref 3.4–10.8)

## 2024-07-28 LAB — ALPHA-1-ANTITRYPSIN: A-1 Antitrypsin: 141 mg/dL (ref 95–164)

## 2024-07-28 LAB — ANCA TITERS
Atypical pANCA: <1:20 {titer}
C-ANCA: <1:20 {titer}
P-ANCA: <1:20 {titer}

## 2024-07-28 LAB — IGE: IgE (Immunoglobulin E), Serum: 75 [IU]/mL (ref 6–495)

## 2024-07-28 LAB — ASPERGILLUS PRECIPITINS

## 2024-07-31 ENCOUNTER — Telehealth: Payer: Self-pay | Admitting: *Deleted

## 2024-07-31 ENCOUNTER — Ambulatory Visit: Payer: Self-pay | Admitting: Allergy & Immunology

## 2024-07-31 NOTE — Telephone Encounter (Signed)
 Called patient and advise approval, copay card and submit to [facility] for Xolair. Will reach out once delivery set to make appt to start therapy in clinic with one hour wait

## 2024-07-31 NOTE — Telephone Encounter (Signed)
-----   Message from Marty Shaggy, MD sent at 07/23/2024  1:05 PM EST ----- Brady Conley Brady Conley! 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 I sent a new IgE level today. Consent signed.

## 2024-08-04 ENCOUNTER — Telehealth: Payer: Self-pay

## 2024-08-04 MED ORDER — PREDNISONE 20 MG PO TABS: ORAL_TABLET | ORAL | 0 refills | Status: AC

## 2024-08-04 NOTE — Telephone Encounter (Signed)
 Mychart message sent with Candida Instructions. Prednisone  has been sent into the pharmacy.

## 2024-08-05 NOTE — Telephone Encounter (Signed)
 Thanks, Tammy.

## 2024-08-08 NOTE — Telephone Encounter (Signed)
 PT spouse called to help schedule Xolair  shot, said it was delivered. I advised of note from Tammy indicating she would reach out to help schedule, so will reach out to her and have her help PT (or I will call back to help), she thanked

## 2024-08-10 NOTE — Telephone Encounter (Signed)
 Appt scheduled

## 2024-08-12 ENCOUNTER — Ambulatory Visit (INDEPENDENT_AMBULATORY_CARE_PROVIDER_SITE_OTHER)

## 2024-08-12 DIAGNOSIS — Z91010 Allergy to peanuts: Secondary | ICD-10-CM

## 2024-08-12 MED ORDER — OMALIZUMAB 150 MG ~~LOC~~ SOLR
300.0000 mg | SUBCUTANEOUS | Status: AC
  Administered 2024-08-12: 300 mg via SUBCUTANEOUS

## 2024-08-12 NOTE — Progress Notes (Signed)
 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

## 2024-08-18 ENCOUNTER — Ambulatory Visit: Admitting: Internal Medicine

## 2024-08-18 VITALS — BP 132/88 | HR 84 | Resp 18 | Wt 216.1 lb

## 2024-08-18 DIAGNOSIS — J302 Other seasonal allergic rhinitis: Secondary | ICD-10-CM

## 2024-08-18 DIAGNOSIS — J3089 Other allergic rhinitis: Secondary | ICD-10-CM | POA: Diagnosis not present

## 2024-08-18 NOTE — Progress Notes (Signed)
 "  RAPID DESENSITIZATION Note  RE: Brady Conley MRN: [id number] DOB: [birth year] Date of Office Visit: 08/18/2024  Subjective:  Patient presents today for rapid desensitization.  Interval History: Patient has not been ill, he has taken all premedications as per protocol.  Recent/Current History: Pulmonary disease: no Cardiac disease: no Respiratory infection: no Rash: no Itch: no Swelling: no Cough: no Shortness of breath: no Runny/stuffy nose: no Itchy eyes: no Beta-blocker use: no  Patient/guardian was informed of the procedure with verbalized understanding of the risk of anaphylaxis. Consent has been signed.   Medication List:  Current Outpatient Medications  Medication Sig Dispense Refill   albuterol (VENTOLIN HFA) 108 (90 Base) MCG/ACT inhaler Inhale 2 puffs into the lungs every 4 (four) hours.     ALPRAZolam (XANAX) 0.5 MG tablet Take 0.5 mg by mouth at bedtime as needed for anxiety.     amphetamine-dextroamphetamine (ADDERALL XR) 5 MG 24 hr capsule Take 5 mg by mouth daily.     anastrozole (ARIMIDEX) 1 MG tablet SMARTSIG:0.5 Tablet(s) By Mouth Once a Week     chlorpheniramine -HYDROcodone (TUSSIONEX) 10-8 MG/5ML as needed.     EPINEPHrine  (EPIPEN  2-PAK) 0.3 mg/0.3 mL IJ SOAJ injection Inject 0.3 mg into the muscle as needed for anaphylaxis.     famotidine  (PEPCID ) 40 MG tablet Take 1 tablet (40 mg total) by mouth daily. 90 tablet 1   montelukast (SINGULAIR) 10 MG tablet Take 10 mg by mouth at bedtime.     Olopatadine-Mometasone (RYALTRIS ) 665-25 MCG/ACT SUSP INSTILL 2 SPRAYS IN EACH NOSTRIL 1-2 times DAILY AS NEEDED 29 g 5   omeprazole  (PRILOSEC) 20 MG capsule Take 1 capsule (20 mg total) by mouth daily. 90 capsule 1   predniSONE  (DELTASONE ) 20 MG tablet Take 2 tablets (40mg ) by mouth in the morning 1 day before [facility] Main appointment. Take 2 tablets (40mg ) by mouth morning of RUSH appointment. 4 tablet 0   sucralfate  (CARAFATE ) 1 g tablet Take 1 tablet (1 g total) by mouth  4 (four) times daily -  with meals and at bedtime. 120 tablet 0   tadalafil (CIALIS) 5 MG tablet Take 5 mg by mouth daily.     testosterone cypionate (DEPOTESTOSTERONE CYPIONATE) 200 MG/ML injection INJECT ONE ML INTRAMUSCULARLY TWICE A WEEK. 10 mL 2   valACYclovir (VALTREX) 1000 MG tablet SMARTSIG:1 Tablet(s) By Mouth Every 12 Hours (Patient taking differently: as needed.)     Current Facility-Administered Medications  Medication Dose Route Frequency Provider Last Rate Last Admin   omalizumab  (XOLAIR ) injection 300 mg  300 mg Subcutaneous Q14 Days Luke Orlan HERO, DO   300 mg at 08/12/24 1143   Allergies: Allergies[1] I reviewed his past medical history, social history, family history, and environmental history and no significant changes have been reported from his previous visit.  ROS: Negative except as per HPI.  Objective: There were no vitals taken for this visit. There is no height or weight on file to calculate BMI.  Physical Exam: GEN: alert, well developed HEENT: clear conjunctiva, nose with mild inferior turbinate hypertrophy, pink nasal mucosa, slight clear rhinorrhea, + cobblestoning HEART: regular rate and rhythm, no murmur LUNGS: clear to auscultation bilaterally, no coughing, unlabored respiration SKIN: no rashes or lesions  Post: GEN: alert, well developed HEENT: clear conjunctiva, nose without rhinorrhea  HEART: regular rate and rhythm, no murmur LUNGS: clear to auscultation bilaterally, no coughing, unlabored respiration SKIN: no rashes or lesions  Diagnostics: PROCEDURES:  Step 1:  0.71ml - 1:10,000 dilution (silver vial) Step 2:  0.3ml - 1:10,000 dilution (silver vial) Step 3: 0.1ml - 1:1,000 dilution (green vial)  Step 4: 0.3ml - 1:1,000 dilution (green vial)  Step 5: 0.1ml - 1:100 dilution (blue vial) Step 6: 0.2ml - 1:100 dilution (blue vial) Step 7: 0.3ml - 1:100 dilution (blue vial) Step 8: 0.4ml - 1:100 dilution (blue vial)  Patient was observed for  ~1.5 hour after the last dose.   Procedure started at 8:25 AM Procedure ended at 2:30 PM   ASSESSMENT/PLAN:   Patient has tolerated the rapid desensitization protocol.  Next appointment: Start at 0.05ml of 1:10 dilution (yellow vial) and build up per protocol next Monday. Then can switch to Wednesdays or Fridays for weekly shots.      [1] No Known Allergies  "

## 2024-08-19 ENCOUNTER — Other Ambulatory Visit: Payer: Self-pay | Admitting: Allergy & Immunology

## 2024-08-22 ENCOUNTER — Ambulatory Visit: Admitting: Family Medicine

## 2024-08-25 ENCOUNTER — Ambulatory Visit (INDEPENDENT_AMBULATORY_CARE_PROVIDER_SITE_OTHER)

## 2024-08-25 DIAGNOSIS — J309 Allergic rhinitis, unspecified: Secondary | ICD-10-CM | POA: Diagnosis not present

## 2024-09-03 ENCOUNTER — Ambulatory Visit (INDEPENDENT_AMBULATORY_CARE_PROVIDER_SITE_OTHER)

## 2024-09-03 DIAGNOSIS — J302 Other seasonal allergic rhinitis: Secondary | ICD-10-CM

## 2024-09-10 ENCOUNTER — Ambulatory Visit

## 2024-09-10 DIAGNOSIS — Z91010 Allergy to peanuts: Secondary | ICD-10-CM | POA: Diagnosis not present

## 2024-09-10 MED ORDER — OMALIZUMAB 300 MG/2  ML ~~LOC~~ SOSY
300.0000 mg | PREFILLED_SYRINGE | SUBCUTANEOUS | Status: AC
  Administered 2024-09-10: 300 mg via SUBCUTANEOUS

## 2024-09-12 ENCOUNTER — Ambulatory Visit (INDEPENDENT_AMBULATORY_CARE_PROVIDER_SITE_OTHER)

## 2024-09-12 DIAGNOSIS — J302 Other seasonal allergic rhinitis: Secondary | ICD-10-CM | POA: Diagnosis not present

## 2024-09-19 ENCOUNTER — Ambulatory Visit

## 2024-09-19 DIAGNOSIS — J302 Other seasonal allergic rhinitis: Secondary | ICD-10-CM | POA: Diagnosis not present

## 2024-09-26 ENCOUNTER — Ambulatory Visit (INDEPENDENT_AMBULATORY_CARE_PROVIDER_SITE_OTHER)

## 2024-09-26 DIAGNOSIS — J302 Other seasonal allergic rhinitis: Secondary | ICD-10-CM

## 2024-10-08 ENCOUNTER — Ambulatory Visit

## 2024-12-26 ENCOUNTER — Ambulatory Visit: Admitting: Allergy & Immunology
